# Patient Record
Sex: Female | Born: 1975 | Race: White | Hispanic: No | Marital: Married | Smoking: Never smoker
Health system: Southern US, Community
[De-identification: ages and names within clinical notes are randomized; demographics above are authoritative.]

## PROBLEM LIST (undated history)

## (undated) DIAGNOSIS — D259 Leiomyoma of uterus, unspecified: Secondary | ICD-10-CM

## (undated) DIAGNOSIS — K5790 Diverticulosis of intestine, part unspecified, without perforation or abscess without bleeding: Secondary | ICD-10-CM

## (undated) DIAGNOSIS — R011 Cardiac murmur, unspecified: Secondary | ICD-10-CM

## (undated) DIAGNOSIS — J45909 Unspecified asthma, uncomplicated: Secondary | ICD-10-CM

## (undated) DIAGNOSIS — N920 Excessive and frequent menstruation with regular cycle: Secondary | ICD-10-CM

## (undated) DIAGNOSIS — Z973 Presence of spectacles and contact lenses: Secondary | ICD-10-CM

## (undated) DIAGNOSIS — K8020 Calculus of gallbladder without cholecystitis without obstruction: Secondary | ICD-10-CM

## (undated) HISTORY — DX: Unspecified asthma, uncomplicated: J45.909

## (undated) HISTORY — DX: Diverticulosis of intestine, part unspecified, without perforation or abscess without bleeding: K57.90

## (undated) HISTORY — DX: Calculus of gallbladder without cholecystitis without obstruction: K80.20

---

## 2002-04-16 HISTORY — PX: TUBAL LIGATION: SHX77

## 2005-06-20 ENCOUNTER — Encounter: Admission: RE | Admit: 2005-06-20 | Discharge: 2005-06-20 | Payer: Self-pay | Admitting: Family Medicine

## 2006-03-12 ENCOUNTER — Encounter: Admission: RE | Admit: 2006-03-12 | Discharge: 2006-03-12 | Payer: Self-pay | Admitting: Family Medicine

## 2006-04-07 ENCOUNTER — Encounter: Admission: RE | Admit: 2006-04-07 | Discharge: 2006-04-07 | Payer: Self-pay | Admitting: Family Medicine

## 2006-08-14 ENCOUNTER — Other Ambulatory Visit: Admission: RE | Admit: 2006-08-14 | Discharge: 2006-08-14 | Payer: Self-pay | Admitting: Obstetrics & Gynecology

## 2007-07-16 ENCOUNTER — Encounter: Admission: RE | Admit: 2007-07-16 | Discharge: 2007-07-16 | Payer: Self-pay | Admitting: Family Medicine

## 2007-08-21 ENCOUNTER — Ambulatory Visit: Payer: Self-pay | Admitting: Vascular Surgery

## 2008-10-26 DIAGNOSIS — J45909 Unspecified asthma, uncomplicated: Secondary | ICD-10-CM | POA: Insufficient documentation

## 2008-11-24 ENCOUNTER — Other Ambulatory Visit: Admission: RE | Admit: 2008-11-24 | Discharge: 2008-11-24 | Payer: Self-pay | Admitting: Obstetrics & Gynecology

## 2009-01-14 HISTORY — PX: COLONOSCOPY: SHX174

## 2009-01-23 LAB — HM COLONOSCOPY: HM Colonoscopy: NORMAL

## 2009-03-09 ENCOUNTER — Encounter: Admission: RE | Admit: 2009-03-09 | Discharge: 2009-03-09 | Payer: Self-pay | Admitting: Family Medicine

## 2009-03-15 DIAGNOSIS — K8020 Calculus of gallbladder without cholecystitis without obstruction: Secondary | ICD-10-CM | POA: Insufficient documentation

## 2009-03-15 DIAGNOSIS — K5730 Diverticulosis of large intestine without perforation or abscess without bleeding: Secondary | ICD-10-CM | POA: Insufficient documentation

## 2009-07-12 DIAGNOSIS — J45901 Unspecified asthma with (acute) exacerbation: Secondary | ICD-10-CM | POA: Insufficient documentation

## 2009-08-16 DIAGNOSIS — K5790 Diverticulosis of intestine, part unspecified, without perforation or abscess without bleeding: Secondary | ICD-10-CM

## 2009-08-16 HISTORY — DX: Diverticulosis of intestine, part unspecified, without perforation or abscess without bleeding: K57.90

## 2009-09-16 DIAGNOSIS — K8020 Calculus of gallbladder without cholecystitis without obstruction: Secondary | ICD-10-CM

## 2009-09-16 HISTORY — DX: Calculus of gallbladder without cholecystitis without obstruction: K80.20

## 2009-09-16 HISTORY — PX: LAPAROSCOPIC CHOLECYSTECTOMY: SUR755

## 2009-09-25 ENCOUNTER — Ambulatory Visit: Payer: Self-pay | Admitting: Surgery

## 2009-09-26 ENCOUNTER — Ambulatory Visit: Payer: Self-pay | Admitting: Surgery

## 2010-05-03 DIAGNOSIS — F4322 Adjustment disorder with anxiety: Secondary | ICD-10-CM | POA: Insufficient documentation

## 2011-01-29 NOTE — Procedures (Signed)
DUPLEX DEEP VENOUS EXAM - LOWER EXTREMITY   INDICATION:  Bilateral edema, right greater than left, for several  months.   HISTORY:  Edema:  Bilateral, right greater than left.  Trauma/Surgery:  No.  Pain:  No, however, she complains of some discomfort in the right lower  calf into her foot.  PE:  No.  Previous DVT:  No.  Anticoagulants:  No.  Other:   DUPLEX EXAM:                CFV   SFV   PopV  PTV    GSV                R  L  R  L  R  L  R   L  R  L  Thrombosis    0  0  0  0  0  0  0   0  0  0  Spontaneous   +  +  +  +  +  +  +   +  +  +  Phasic        +  +  +  +  +  +  +   +  +  +  Augmentation  +  +  +  +  +  +  +   +  +  +  Compressible  +  +  +  +  +  +  +   +  +  +  Competent     +  +  +  +  +  +  +   +  +  +   Legend:  + - yes  o - no  p - partial  D - decreased   IMPRESSION:  No evidence of lower extremity deep venous thrombosis bilaterally.  Bilateral greater saphenous veins are noted to be competent.  Both posterior tibial artery signals are within normal limits.   A preliminary copy was faxed, and a preliminary report was called to Dr.  Stephannie Peters office.    _____________________________  Di Kindle. Edilia Bo, M.D.   DP/MEDQ  D:  08/21/2007  T:  08/22/2007  Job:  [id number]

## 2011-04-08 IMAGING — CT CT PELVIS W/ CM
2 of 4 series · 13 of 32 positions shown, 18 images · IV contrast (30CC OMNI 350 & [ID] OMNI 300)
Comparison: Ultrasound 06/20/2005

CT ABDOMEN

CLINICAL DATA: lower abdominal pain.

CT ABDOMEN AND PELVIS WITH CONTRAST
TECHNIQUE: Multidetector CT imaging of the abdomen and pelvis was
performed using the standard protocol following bolus
administration of intravenous contrast.
Contrast: 100 ml Omnipaque 300 IV.

[Series 2: abdomen w/ · axial · 0.82mm/px · z∈[-335,-45]mm · 5 of 88 slices shown, 10 images]
[im 15/88  soft-tissue]
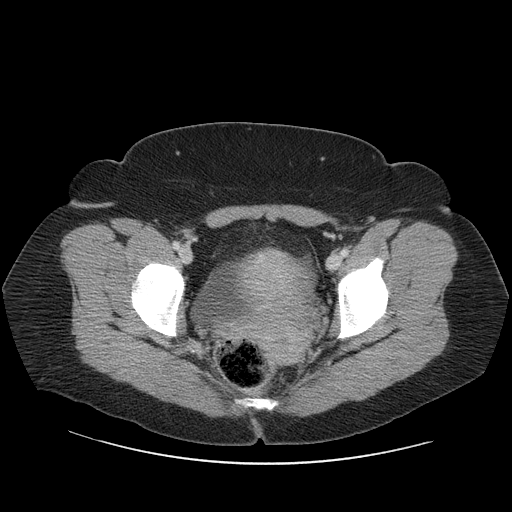
[im 15/88  bone]
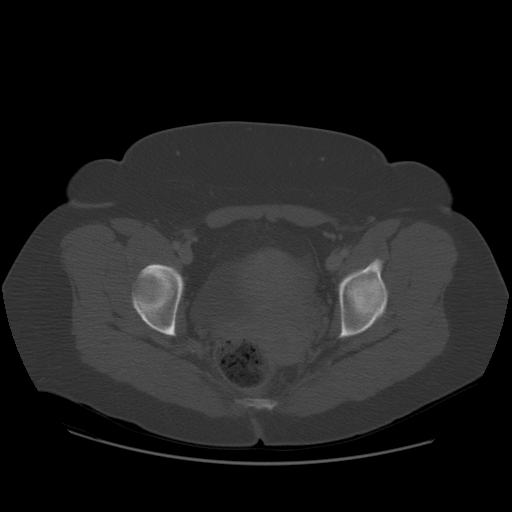
[im 30/88  soft-tissue]
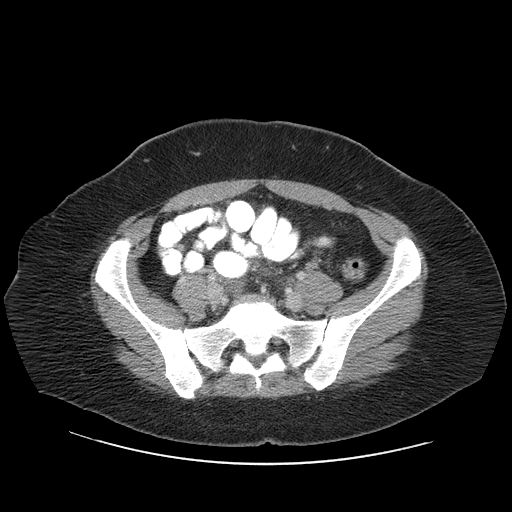
[im 30/88  lung]
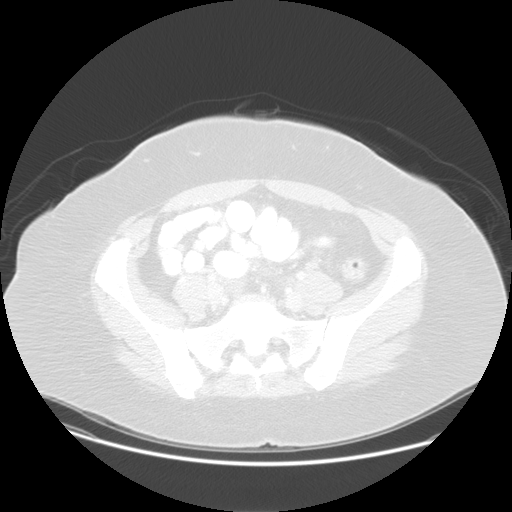
[im 44/88  soft-tissue]
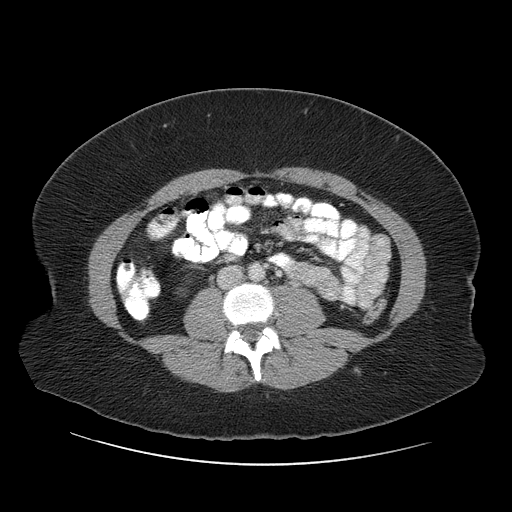
[im 44/88  lung]
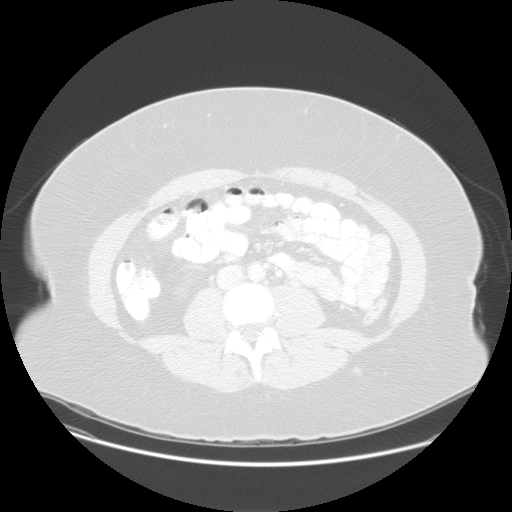
[im 59/88  soft-tissue]
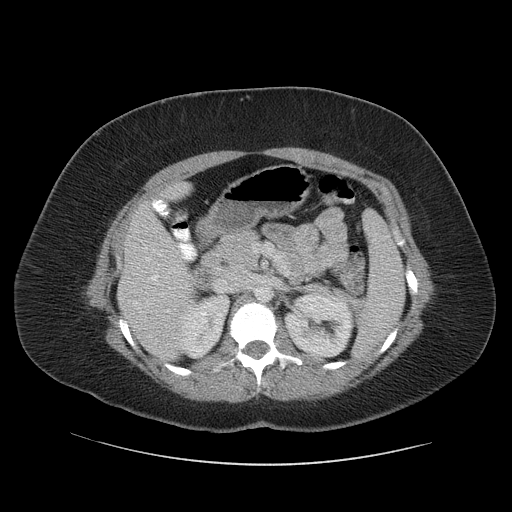
[im 59/88  lung]
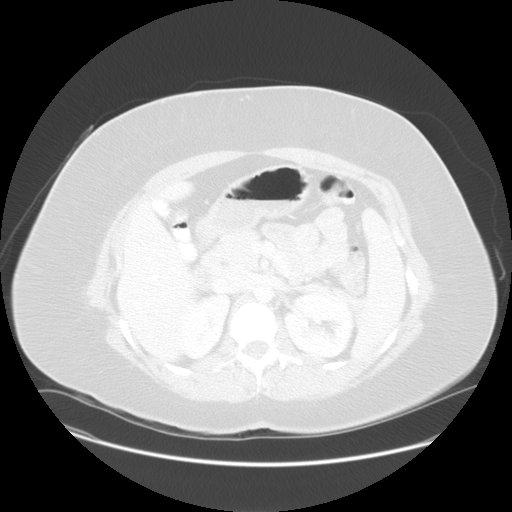
[im 73/88  soft-tissue]
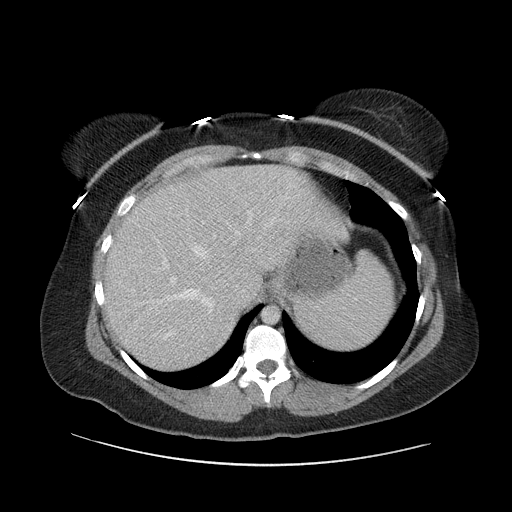
[im 73/88  lung]
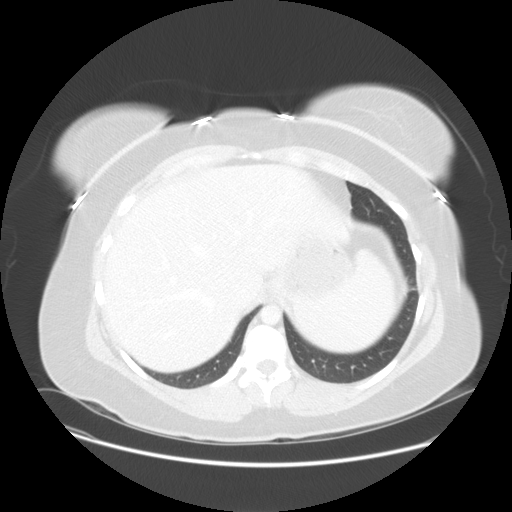

[Series 401: sagittal · sagittal · 0.91mm/px · 8 of 150 slices shown]
[im 14/150  soft-tissue]
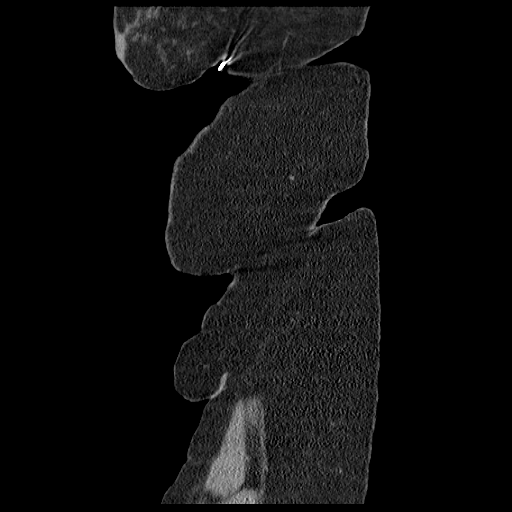
[im 28/150  soft-tissue]
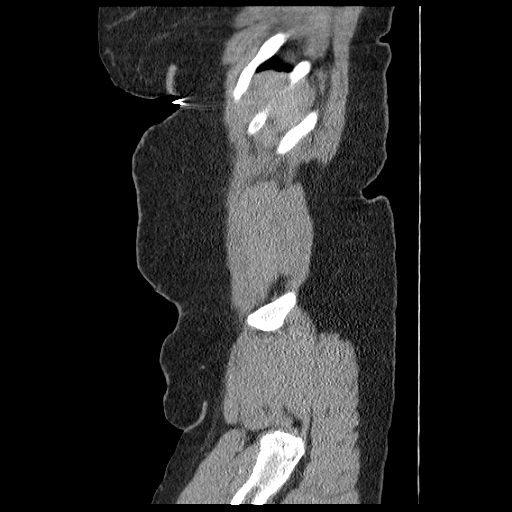
[im 55/150  soft-tissue]
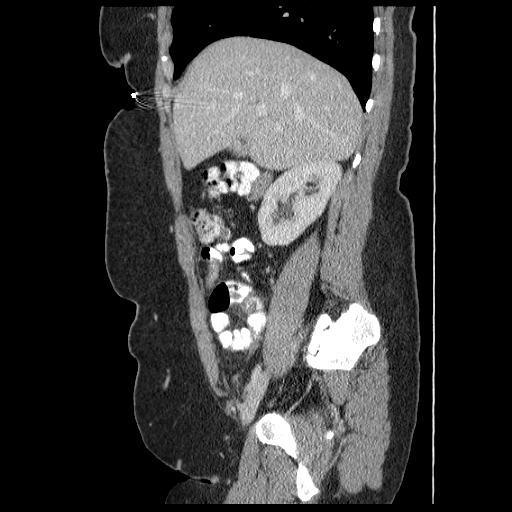
[im 68/150  soft-tissue]
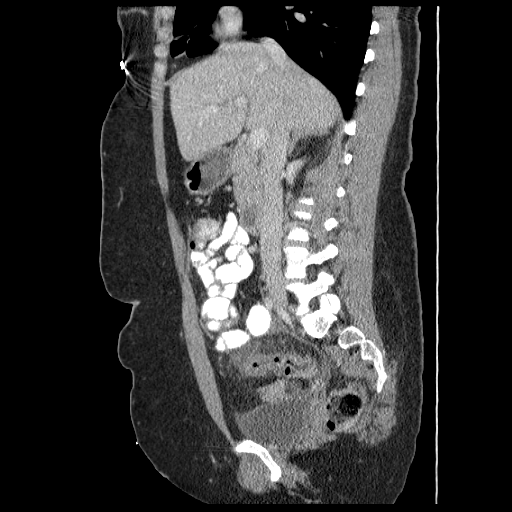
[im 82/150  soft-tissue]
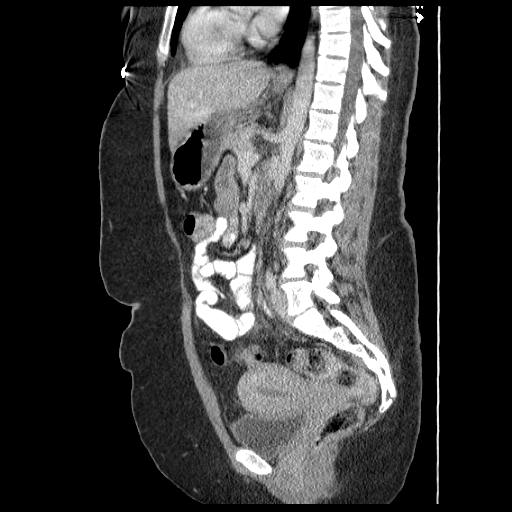
[im 95/150  soft-tissue]
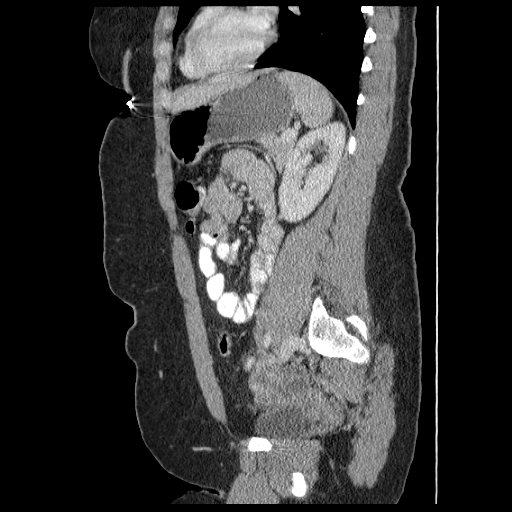
[im 122/150  soft-tissue]
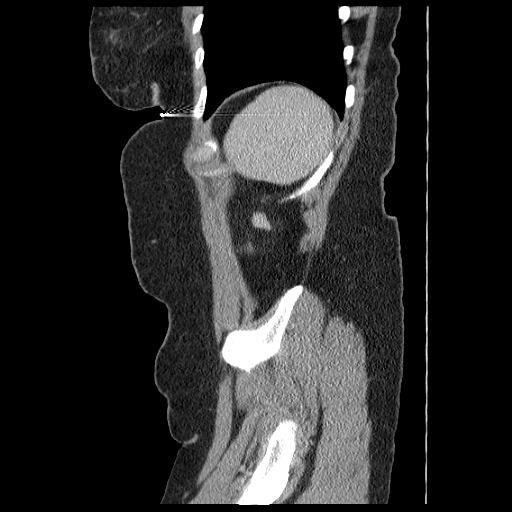
[im 136/150  soft-tissue]
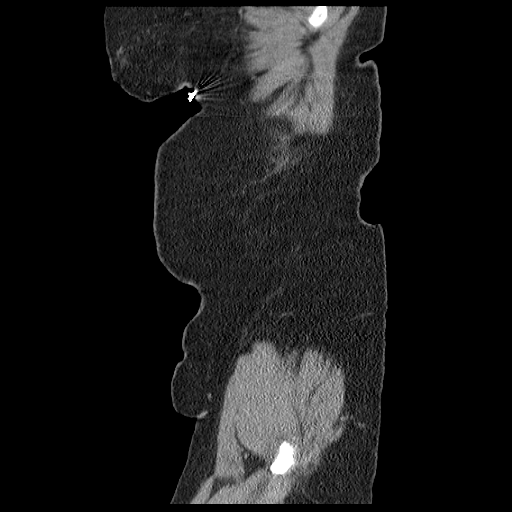

[13 of 32 positions shown; findings below may reference images not displayed]

FINDINGS: Linear densities in the right middle lobe, likely
scarring or atelectasis.  Left lung base clear.  No effusions.
Heart is normal size.

Gallstones fill the gallbladder which is contracted.  Liver,
spleen, pancreas, adrenals, kidneys unremarkable. No
hydronephrosis. No biliary ductal dilatation.

Small umbilical hernia present containing fat. Bowel grossly
unremarkable.  No free fluid, free air, or adenopathy. Aorta is
normal caliber.
IMPRESSION: Cholelithiasis.

CT PELVIS
FINDINGS: There are there is sigmoid diverticulosis.  There is an
area of abnormal circumferential wall thickening and surrounding
inflammatory change, compatible with active diverticulitis.  No
complicating feature.  There is a tiny amount of free fluid in the
pelvis.

Uterus and adnexa unremarkable.  Appendix is not definitively seen.
Bladder unremarkable.

No acute bony abnormality.  Sclerotic focus in the right iliac bone
likely bone island.
IMPRESSION: Inflammatory change of wall thickening involving the sigmoid colon
with adjacent sigmoid diverticula.  Findings compatible with active
diverticulitis.

Tiny amount of free fluid in the pelvis, likely physiologic.

## 2011-06-20 LAB — HM PAP SMEAR: HM Pap smear: NORMAL

## 2013-02-19 ENCOUNTER — Encounter: Payer: Self-pay | Admitting: *Deleted

## 2013-02-22 ENCOUNTER — Ambulatory Visit: Payer: Self-pay | Admitting: Obstetrics & Gynecology

## 2013-03-24 ENCOUNTER — Ambulatory Visit: Payer: Self-pay | Admitting: Obstetrics & Gynecology

## 2013-04-27 ENCOUNTER — Encounter: Payer: Self-pay | Admitting: Obstetrics & Gynecology

## 2013-04-27 ENCOUNTER — Other Ambulatory Visit: Payer: Self-pay | Admitting: *Deleted

## 2013-04-27 ENCOUNTER — Ambulatory Visit (INDEPENDENT_AMBULATORY_CARE_PROVIDER_SITE_OTHER): Payer: Managed Care, Other (non HMO) | Admitting: Obstetrics & Gynecology

## 2013-04-27 VITALS — BP 126/78 | HR 64 | Resp 16 | Ht 61.5 in | Wt 171.2 lb

## 2013-04-27 DIAGNOSIS — Z01419 Encounter for gynecological examination (general) (routine) without abnormal findings: Secondary | ICD-10-CM

## 2013-04-27 DIAGNOSIS — Z124 Encounter for screening for malignant neoplasm of cervix: Secondary | ICD-10-CM

## 2013-04-27 DIAGNOSIS — Z0000 Encounter for general adult medical examination without abnormal findings: Secondary | ICD-10-CM

## 2013-04-27 DIAGNOSIS — D219 Benign neoplasm of connective and other soft tissue, unspecified: Secondary | ICD-10-CM

## 2013-04-27 LAB — POCT URINALYSIS DIPSTICK
Bilirubin, UA: NEGATIVE
Glucose, UA: NEGATIVE
Ketones, UA: NEGATIVE
Nitrite, UA: NEGATIVE
Protein, UA: NEGATIVE
Urobilinogen, UA: NEGATIVE
pH, UA: 6

## 2013-04-27 NOTE — Patient Instructions (Signed)

## 2013-04-27 NOTE — Progress Notes (Signed)
37 y.o. G2P2 Divorced Caucasian Fe here for annual exam.  Cycles fairly regular.  Flow can last 3-7 days.  Does have occasional heavy days.   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  Brought labs with her.  All normal.  Will scan copy.  LMP: 04/21/13     Sexually active: yes  The current method of family planning is tubal ligation.    Exercising: yes  walking  Smoker:  no  Health Maintenance: Pap:  06/20/2011  Negative  MMG:  never Colonoscopy:  01/2009 repeat 5 years BMD:   never TDaP:  2010 Labs: Urine:WBC-+1, RBC-trace, PH 6.0   reports that she has never smoked. She has never used smokeless tobacco. She reports that she does not drink alcohol or use illicit drugs.  Past Medical History  Diagnosis Date  . Diverticulosis 08/2009    Past Surgical History  Procedure Laterality Date  . Colonoscopy  5/10    Current Outpatient Prescriptions  Medication Sig Dispense Refill  . ALBUTEROL IN Inhale into the lungs.       No current facility-administered medications for this visit.    No family history on file.  ROS:  Pertinent items are noted in HPI.  Otherwise, a comprehensive ROS was negative.  Exam:   Ht 5' 1.5" (1.562 m)  Wt 171 lb 3.2 oz (77.656 kg)  BMI 31.83 kg/m2 Height: 5' 1.5" (156.2 cm)  Ht Readings from Last 3 Encounters:  04/27/13 5' 1.5" (1.562 m)    General appearance: alert, cooperative and appears stated age Head: Normocephalic, without obvious abnormality, atraumatic Neck: no adenopathy, supple, symmetrical, trachea midline and thyroid normal to inspection and palpation Lungs: clear to auscultation bilaterally Breasts: normal appearance, no masses or tenderness Heart: regular rate and rhythm Abdomen: soft, non-tender; no masses,  no organomegaly Extremities: extremities normal, atraumatic, no cyanosis or edema Skin: Skin color, texture, turgor normal. No rashes or lesions Lymph nodes: Cervical, supraclavicular, and axillary nodes  normal. No abnormal inguinal nodes palpated Neurologic: Grossly normal   Pelvic: External genitalia:  no lesions              Urethra:  normal appearing urethra with no masses, tenderness or lesions              Bartholin's and Skene's: normal                 Vagina: normal appearing vagina with normal color and discharge, no lesions              Cervix: no lesions              Pap taken: yes Bimanual Exam:  Uterus:  Enlarged, mobile, 3-4cm ant fibroid              Adnexa: normal adnexa and no mass, fullness, tenderness               Rectovaginal: Confirms               Anus:  normal sphincter tone, no lesions  A:  Well Woman with normal exam Fibroid uterus  P:   Pap with HR HPV Mammogram starting age 58 Return for PUS return annually or prn  An After Visit Summary was printed and given to the patient.

## 2013-04-28 ENCOUNTER — Telehealth: Payer: Self-pay | Admitting: Obstetrics & Gynecology

## 2013-04-28 NOTE — Telephone Encounter (Signed)
Patient returned Carolynn's call. No other contact number for today other than cell phone per patient.

## 2013-04-28 NOTE — Telephone Encounter (Signed)
Unable to leave message. Vm has not been set up. Call to patient to discuss insurance benefits for PUS scheduled for tmro (8/14)

## 2013-04-29 ENCOUNTER — Encounter: Payer: Self-pay | Admitting: Obstetrics & Gynecology

## 2013-04-29 ENCOUNTER — Ambulatory Visit (INDEPENDENT_AMBULATORY_CARE_PROVIDER_SITE_OTHER): Payer: Managed Care, Other (non HMO) | Admitting: Obstetrics & Gynecology

## 2013-04-29 ENCOUNTER — Ambulatory Visit (INDEPENDENT_AMBULATORY_CARE_PROVIDER_SITE_OTHER): Payer: Managed Care, Other (non HMO)

## 2013-04-29 DIAGNOSIS — N852 Hypertrophy of uterus: Secondary | ICD-10-CM

## 2013-04-29 DIAGNOSIS — D219 Benign neoplasm of connective and other soft tissue, unspecified: Secondary | ICD-10-CM

## 2013-04-29 DIAGNOSIS — D259 Leiomyoma of uterus, unspecified: Secondary | ICD-10-CM

## 2013-04-29 LAB — IPS PAP TEST WITH HPV

## 2013-04-29 NOTE — Progress Notes (Signed)
37 y.o.  G2P2 MWF here for a pelvic ultrasound.  Enlarged/bulky feeling uterus noted on physical exam.  She does have some cycles that are heavy but doesn't want anything done about that unless there is a problem noted today.  Patient's last menstrual period was 04/21/2013.  Sexually active:  yes  Contraception: bilateral tubal ligation  FINDINGS: UTERUS: 8.3 x 6.3 x 4.8cm without fibroids EMS: 6.56mm ADNEXA:   Left ovary 3.0 x 1.9 x 2.2cm   Right ovary 2.8 x 1.9 x 1.9cm.  Both ovaries with normal appearing follicles CUL DE SAC: neg for free fluid  Assessment:  Enlarged uterus Plan: Follow-up one year or prn new issues

## 2013-04-29 NOTE — Patient Instructions (Signed)
Please call if you have any new issues/problems 

## 2013-07-22 ENCOUNTER — Other Ambulatory Visit: Payer: Self-pay

## 2014-05-10 ENCOUNTER — Encounter: Payer: Self-pay | Admitting: Obstetrics & Gynecology

## 2014-07-18 ENCOUNTER — Encounter: Payer: Self-pay | Admitting: Obstetrics & Gynecology

## 2014-07-20 ENCOUNTER — Ambulatory Visit: Payer: Managed Care, Other (non HMO) | Admitting: Obstetrics & Gynecology

## 2014-07-22 ENCOUNTER — Ambulatory Visit: Payer: Managed Care, Other (non HMO) | Admitting: Obstetrics & Gynecology

## 2014-08-10 ENCOUNTER — Ambulatory Visit: Payer: Managed Care, Other (non HMO) | Admitting: Obstetrics & Gynecology

## 2015-07-25 ENCOUNTER — Encounter: Payer: Self-pay | Admitting: Obstetrics & Gynecology

## 2015-07-25 ENCOUNTER — Ambulatory Visit (INDEPENDENT_AMBULATORY_CARE_PROVIDER_SITE_OTHER): Payer: Managed Care, Other (non HMO) | Admitting: Obstetrics & Gynecology

## 2015-07-25 VITALS — BP 122/78 | HR 80 | Resp 14 | Ht 62.0 in | Wt 183.0 lb

## 2015-07-25 DIAGNOSIS — Z800 Family history of malignant neoplasm of digestive organs: Secondary | ICD-10-CM

## 2015-07-25 DIAGNOSIS — Z01419 Encounter for gynecological examination (general) (routine) without abnormal findings: Secondary | ICD-10-CM

## 2015-07-25 DIAGNOSIS — Z124 Encounter for screening for malignant neoplasm of cervix: Secondary | ICD-10-CM

## 2015-07-25 MED ORDER — ALPRAZOLAM 0.5 MG PO TABS: 0.5000 mg | ORAL_TABLET | ORAL | Status: DC | PRN

## 2015-07-25 NOTE — Progress Notes (Signed)
Patient ID: Christina Faulkner, female   DOB: February 20, 1976, 39 y.o.   MRN: [id number]   39 y.o. G2P2 DivorcedCaucasianF here for annual exam.  Very busy with work.  Cycles are regular.    Father diagnosed with colon cancer in May.  This was his second colon cancer diagnosis and was at stage 3.  He was 63.  Initial colon cancer at age 70.  Paternal uncle had a polyp at age 9.  Was also diagnosed with colon cancer.  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    Pt has already initiated colonoscopy testing.  Last was 10/15.  She knows she needs to have genetic testing but needs my help getting this scheduled.    Work is busy.  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     Patient's last menstrual period was 07/06/2015.          Sexually active: Yes.    The current method of family planning is tubal ligation.    Exercising: No.  The patient does not participate in regular exercise at present. Smoker:  no  Health Maintenance: Pap:  04-27-13 WNL NEG HR HPV History of abnormal Pap:  no MMG:  Never Colonoscopy:  06/2014 WNL BMD:   Never TDaP:  05/2015  Screening Labs: PCP does labs    reports that she has never smoked. She has never used smokeless tobacco. She reports that she does not drink alcohol or use illicit drugs.  Past Medical History  Diagnosis Date  . Diverticulosis 08/2009  . Asthma   . Cholelithiasis     Past Surgical History  Procedure Laterality Date  . Colonoscopy  5/10  . Tubal ligation  8/03  . Laparoscopic cholecystectomy  1/11    hernia repair    Current Outpatient Prescriptions  Medication Sig Dispense Refill  . ALBUTEROL IN Inhale into the lungs.    . ALPRAZolam (XANAX) 0.5 MG tablet Take 0.5 mg by mouth as needed for sleep.     No current facility-administered medications for this visit.    Family History  Problem Relation Age of Onset  . Cancer Father     removed kidney-aggressive tumor in ureter  . Colon cancer Father 24    63 recurrent colon cancer   . Colon cancer Paternal Grandfather   . Diabetes Maternal Aunt   . Diabetes Maternal Grandmother   . Diabetes Maternal Grandfather   . Hypertension Maternal Grandfather   . Heart disease Maternal Grandfather   . Migraines Sister     ROS:  Pertinent items are noted in HPI.  Otherwise, a comprehensive ROS was negative.  Exam:   BP 122/78 mmHg  Pulse 80  Resp 14  Ht 5\' 2"  (1.575 m)  Wt 183 lb (83.008 kg)  BMI 33.46 kg/m2  LMP 07/06/2015  Weight change: +12#  Height: 5\' 2"  (157.5 cm)  Ht Readings from Last 3 Encounters:  07/25/15 5\' 2"  (1.575 m)  04/27/13 5' 1.5" (1.562 m)    General appearance: alert, cooperative and appears stated age Head: Normocephalic, without obvious abnormality, atraumatic Neck: no adenopathy, supple, symmetrical, trachea midline and thyroid normal to inspection and palpation Lungs: clear to auscultation bilaterally Breasts: normal appearance, no masses or tenderness Heart: regular rate and rhythm Abdomen: soft, non-tender; bowel sounds normal; no masses,  no organomegaly Extremities: extremities normal, atraumatic, no cyanosis or edema Skin: Skin color, texture, turgor normal. No rashes or lesions Lymph nodes: Cervical, supraclavicular, and axillary nodes normal. No abnormal inguinal nodes palpated  Neurologic: Grossly normal   Pelvic: External genitalia:  no lesions              Urethra:  normal appearing urethra with no masses, tenderness or lesions              Bartholins and Skenes: normal                 Vagina: normal appearing vagina with normal color and discharge, no lesions              Cervix: no lesions              Pap taken: Yes.   Bimanual Exam:  Uterus:  normal size, contour, position, consistency, mobility, non-tender              Adnexa: normal adnexa and no mass, fullness, tenderness               Rectovaginal: Confirms               Anus:  normal sphincter tone, no lesions  Chaperone was present for exam.  A:  Well Woman  with normal exam Fibroid uterus Family hx of colon cancer, father now with second diagnosis of colon cancer (first diagnosis was 93) and also paternal uncle  P: Mammogram starting age 63 Pap with HR HPV obtained today Labs with Christina Faulkner Will plan genetic consultation with pt for Lynch syndrome screening.  This needs to be done through medical genetics testing with [facility] if possible.  Pt not sure about what specific clinic.  I will need to do some research and contact her with this information. return annually or prn

## 2015-07-26 LAB — IPS PAP TEST WITH REFLEX TO HPV

## 2015-07-27 DIAGNOSIS — Z800 Family history of malignant neoplasm of digestive organs: Secondary | ICD-10-CM | POA: Insufficient documentation

## 2015-08-01 ENCOUNTER — Telehealth: Payer: Self-pay | Admitting: Emergency Medicine

## 2015-08-01 DIAGNOSIS — Z800 Family history of malignant neoplasm of digestive organs: Secondary | ICD-10-CM

## 2015-08-01 NOTE — Telephone Encounter (Signed)
-----   Message from Megan Salon, MD sent at 07/27/2015  5:22 PM EST ----- Regarding: appt for pt Christina Faulkner, Mrs. Christina Faulkner, seen on Tues, has a new family diagnosis of Lynch Syndrome in her father and paternal uncle.  Both have had colon cancer.  Her father is now with his second diagnosis.    Pt has neg colonoscopy 10/15.  With this new lynch syndrome diagnosis, she needs to be tested.  She works for [employer].  Can you schedule appt for her with at: [facility] with [address] [place], [place] [address]   [phone]  Pt was advised to make sure she takes copy of her father's/uncle's results with her so she can be tested for their exact genetic abnormality.  Please remind her.  Thanks.  MSM

## 2015-08-01 NOTE — Telephone Encounter (Signed)
Called Genetics and transferred to oncology scheduling at [phone].  Left message to request referral information.  Genetics Fax [phone].

## 2015-08-04 NOTE — Telephone Encounter (Signed)
2nd call to genetics for scheduling. Sent to voicemail without speaking to a representative. Called [phone] during regular business hours. Left voicemail requesting return call for scheduling. Will fax all information to [phone] in addition to voicemail.

## 2015-08-29 NOTE — Telephone Encounter (Signed)
Patient confirmed she is scheduled for appointment 09/21/15.

## 2015-10-13 ENCOUNTER — Ambulatory Visit: Payer: Managed Care, Other (non HMO) | Admitting: Obstetrics & Gynecology

## 2016-07-23 ENCOUNTER — Telehealth: Payer: Self-pay | Admitting: Obstetrics & Gynecology

## 2016-07-23 NOTE — Telephone Encounter (Signed)
Left message to call and reschedule cancelled appointment.

## 2016-08-26 ENCOUNTER — Telehealth: Payer: Self-pay | Admitting: Obstetrics & Gynecology

## 2016-08-26 ENCOUNTER — Ambulatory Visit: Payer: Managed Care, Other (non HMO) | Admitting: Obstetrics & Gynecology

## 2016-08-26 NOTE — Telephone Encounter (Signed)
Patient is sick and cancelled her aex today. Will call back to reschedule. No dnka fee charged.

## 2016-10-28 ENCOUNTER — Ambulatory Visit (INDEPENDENT_AMBULATORY_CARE_PROVIDER_SITE_OTHER): Payer: Managed Care, Other (non HMO) | Admitting: Obstetrics & Gynecology

## 2016-10-28 ENCOUNTER — Encounter: Payer: Self-pay | Admitting: Obstetrics & Gynecology

## 2016-10-28 VITALS — BP 106/82 | HR 88 | Resp 14 | Ht 61.5 in | Wt 160.6 lb

## 2016-10-28 DIAGNOSIS — Z01419 Encounter for gynecological examination (general) (routine) without abnormal findings: Secondary | ICD-10-CM

## 2016-10-28 NOTE — Progress Notes (Signed)
41 y.o. G2P2 Divorced Caucasian F here for annual exam.  Son is graduating from high school this year and wants to be a Agricultural consultant.    Job has gotten better.  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    Cycles regular and last about 5-7 days.  Second and third days are heaviest.   Patient's last menstrual period was 10/20/2016.          Sexually active: Yes.    The current method of family planning is tubal ligation.    Exercising: Yes.    walking Smoker:  no  Health Maintenance: Pap:  07/25/15 negative, 04/27/13 negative, HR HPV negative  History of abnormal Pap:  no MMG:  never Colonoscopy:  06/2014 normal.  Follow up 5 years.  BMD:   never TDaP:  2010  Pneumonia vaccine(s):  never Zostavax:   never Hep C testing: not indicated  Screening Labs: PCP, Hb today: PCP, Urine today: PCP   reports that she has never smoked. She has never used smokeless tobacco. She reports that she does not drink alcohol or use drugs.  Past Medical History:  Diagnosis Date  . Asthma   . Cholelithiasis   . Diverticulosis 08/2009    Past Surgical History:  Procedure Laterality Date  . COLONOSCOPY  5/10  . LAPAROSCOPIC CHOLECYSTECTOMY  1/11   hernia repair  . TUBAL LIGATION  8/03    Current Outpatient Prescriptions  Medication Sig Dispense Refill  . albuterol (PROAIR HFA) 108 (90 Base) MCG/ACT inhaler INHALE 2 PUFFS INTO THE LUNGS EVERY 6 HOURS AS NEEDED FOR WHEEZING    . ALPRAZolam (XANAX) 0.5 MG tablet Take 1 tablet (0.5 mg total) by mouth as needed for sleep. 30 tablet 2   No current facility-administered medications for this visit.     Family History  Problem Relation Age of Onset  . Cancer Father     removed kidney-aggressive tumor in ureter  . Colon cancer Father 24    63 recurrent colon cancer  . Colon cancer Paternal Grandfather   . Diabetes Maternal Aunt   . Diabetes Maternal Grandmother   . Diabetes Maternal Grandfather   . Hypertension Maternal  Grandfather   . Heart disease Maternal Grandfather   . Migraines Sister     ROS:  Pertinent items are noted in HPI.  Otherwise, a comprehensive ROS was negative.  Exam:   BP 106/82 (BP Location: Right Arm, Patient Position: Sitting, Cuff Size: Normal)   Pulse 88   Resp 14   Ht 5' 1.5" (1.562 m)   Wt 160 lb 9.6 oz (72.8 kg)   LMP 10/20/2016   BMI 29.85 kg/m   Weight:  -23# change:  Height: 5' 1.5" (156.2 cm)  Ht Readings from Last 3 Encounters:  10/28/16 5' 1.5" (1.562 m)  07/25/15 5\' 2"  (1.575 m)  04/27/13 5' 1.5" (1.562 m)   General appearance: alert, cooperative and appears stated age Head: Normocephalic, without obvious abnormality, atraumatic Neck: no adenopathy, supple, symmetrical, trachea midline and thyroid normal to inspection and palpation Lungs: clear to auscultation bilaterally Breasts: normal appearance, no masses or tenderness Heart: regular rate and rhythm Abdomen: soft, non-tender; bowel sounds normal; no masses,  no organomegaly Extremities: extremities normal, atraumatic, no cyanosis or edema Skin: Skin color, texture, turgor normal. No rashes or lesions Lymph nodes: Cervical, supraclavicular, and axillary nodes normal. No abnormal inguinal nodes palpated Neurologic: Grossly normal   Pelvic: External genitalia:  no lesions  Urethra:  normal appearing urethra with no masses, tenderness or lesions              Bartholins and Skenes: normal                 Vagina: normal appearing vagina with normal color and discharge, no lesions              Cervix: no lesions              Pap taken: No. Bimanual Exam:  Uterus:  normal size, contour, position, consistency, mobility, non-tender              Adnexa: normal adnexa and no mass, fullness, tenderness               Rectovaginal: Confirms               Anus:  normal sphincter tone, no lesions  Chaperone was present for exam.  A: Well woman exam H/o uterine fibroids Family hx of colon cancer  (father with diagnosis and recurerrence and uncle with hx, PGF with hx.  Father's colon cancer was lynch positive)   P: Mammogram guidelines discussed.  Order given. Pap not obtained.  Neg 2016, Neg with neg HR HVP 2014 Lab work planned next week with Vicenta Aly Pt did "look into" lynch testing last year.  States insurance wouldn't cover.  Highly encouraged her to see genetics as they may know ways to have this covered or to reduce costs.  Pt states she will consider consultation this year. AEX 1 year or follow PRN

## 2016-11-12 ENCOUNTER — Ambulatory Visit: Payer: Managed Care, Other (non HMO) | Admitting: Obstetrics & Gynecology

## 2018-02-03 NOTE — Progress Notes (Deleted)
42 y.o. G2P2 DivorcedCaucasianF here for annual exam.    No LMP recorded.          Sexually active: {yes no:314532}  The current method of family planning is tubal ligation.    Exercising: {yes no:314532}  {types:19826} Smoker:  no  Health Maintenance: Pap: 08-04-15 Neg, 04-27-13 Neg:Neg HR HPV History of abnormal Pap:  {YES NO:22349} MMG:  *** Colonoscopy: 06/2014 normal;next due 06/2019 BMD:   n/a TDaP:  2010 Pneumonia vaccine(s):  *** Shingrix:   *** Hep C testing: *** Screening Labs: ***, Hb today: ***, Urine today: ***   reports that she has never smoked. She has never used smokeless tobacco. She reports that she does not drink alcohol or use drugs.  Past Medical History:  Diagnosis Date  . Asthma   . Cholelithiasis   . Diverticulosis 08/2009    Past Surgical History:  Procedure Laterality Date  . COLONOSCOPY  5/10  . LAPAROSCOPIC CHOLECYSTECTOMY  1/11   hernia repair  . TUBAL LIGATION  8/03    Current Outpatient Medications  Medication Sig Dispense Refill  . albuterol (PROAIR HFA) 108 (90 Base) MCG/ACT inhaler INHALE 2 PUFFS INTO THE LUNGS EVERY 6 HOURS AS NEEDED FOR WHEEZING    . ALPRAZolam (XANAX) 0.5 MG tablet Take 1 tablet (0.5 mg total) by mouth as needed for sleep. 30 tablet 2   No current facility-administered medications for this visit.     Family History  Problem Relation Age of Onset  . Cancer Father        removed kidney-aggressive tumor in ureter  . Colon cancer Father 24       63 recurrent colon cancer  . Colon cancer Paternal Grandfather   . Diabetes Maternal Aunt   . Diabetes Maternal Grandmother   . Diabetes Maternal Grandfather   . Hypertension Maternal Grandfather   . Heart disease Maternal Grandfather   . Migraines Sister     ROS  Exam:   There were no vitals taken for this visit.  Height:      Ht Readings from Last 3 Encounters:  10/28/16 5' 1.5" (1.562 m)  07/25/15 5\' 2"  (1.575 m)  04/27/13 5' 1.5" (1.562 m)    General  appearance: alert, cooperative and appears stated age Head: Normocephalic, without obvious abnormality, atraumatic Neck: no adenopathy, supple, symmetrical, trachea midline and thyroid {EXAM; THYROID:18604} Lungs: clear to auscultation bilaterally Breasts: {Exam; breast:13139::"normal appearance, no masses or tenderness"} Heart: regular rate and rhythm Abdomen: soft, non-tender; bowel sounds normal; no masses,  no organomegaly Extremities: extremities normal, atraumatic, no cyanosis or edema Skin: Skin color, texture, turgor normal. No rashes or lesions Lymph nodes: Cervical, supraclavicular, and axillary nodes normal. No abnormal inguinal nodes palpated Neurologic: Grossly normal   Pelvic: External genitalia:  no lesions              Urethra:  normal appearing urethra with no masses, tenderness or lesions              Bartholins and Skenes: normal                 Vagina: normal appearing vagina with normal color and discharge, no lesions              Cervix: {exam; cervix:14595}              Pap taken: {yes no:314532} Bimanual Exam:  Uterus:  {exam; uterus:12215}              Adnexa: {exam;  adnexa:12223}               Rectovaginal: Confirms               Anus:  normal sphincter tone, no lesions  Chaperone was present for exam.  A:  Well Woman with normal exam  P:   {plan; gyn:5269::"mammogram","pap smear","return annually or prn"}

## 2018-02-06 ENCOUNTER — Ambulatory Visit: Payer: Managed Care, Other (non HMO) | Admitting: Obstetrics & Gynecology

## 2018-04-09 ENCOUNTER — Other Ambulatory Visit (HOSPITAL_COMMUNITY)
Admission: RE | Admit: 2018-04-09 | Discharge: 2018-04-09 | Disposition: A | Discharge status: Home / Self Care | Payer: Managed Care, Other (non HMO) | Source: Ambulatory Visit | Attending: Obstetrics & Gynecology | Admitting: Obstetrics & Gynecology

## 2018-04-09 ENCOUNTER — Ambulatory Visit: Payer: Managed Care, Other (non HMO) | Admitting: Obstetrics & Gynecology

## 2018-04-09 ENCOUNTER — Encounter

## 2018-04-09 ENCOUNTER — Encounter: Payer: Self-pay | Admitting: Obstetrics & Gynecology

## 2018-04-09 VITALS — BP 130/68 | HR 68 | Resp 14 | Ht 62.0 in | Wt 164.0 lb

## 2018-04-09 DIAGNOSIS — N852 Hypertrophy of uterus: Secondary | ICD-10-CM

## 2018-04-09 DIAGNOSIS — Z01419 Encounter for gynecological examination (general) (routine) without abnormal findings: Secondary | ICD-10-CM | POA: Diagnosis not present

## 2018-04-09 DIAGNOSIS — Z124 Encounter for screening for malignant neoplasm of cervix: Secondary | ICD-10-CM | POA: Diagnosis not present

## 2018-04-09 MED ORDER — ALPRAZOLAM 0.5 MG PO TABS: 0.5000 mg | ORAL_TABLET | ORAL | 1 refills | Status: AC | PRN

## 2018-04-09 NOTE — Progress Notes (Addendum)
42 y.o. G2P2 DivorcedCaucasianF here for annual exam.  Doing well.  Father in law had quick illness last year that took a lot of care.  He passed after four months.    Son just graduated the [employer], trade union and doing EMT training now.  Will go to work with a Musician after he finishes the EMT training.     Doing college tours right now for daughter.  She would love to go to [employer] [facility] [employer].    Cycles are regular.  About every other one is heavy with lots of cramping.    Patient's last menstrual period was 03/22/2018 (exact date).          Sexually active: Yes.    The current method of family planning is tubal ligation.    Exercising: Yes.    Walking  Smoker:  no  Health Maintenance: Pap:  07/25/15 History of abnormal Pap:  no MMG:  na Colonoscopy:  12/2013 BMD:   no TDaP:  2010 Pneumonia vaccine(s):  no Shingrix:   no Hep C testing: not indicated Screening Labs: 2/19   reports that she has never smoked. She has never used smokeless tobacco. She reports that she does not drink alcohol or use drugs.  Past Medical History:  Diagnosis Date  . Asthma   . Cholelithiasis   . Diverticulosis 08/2009    Past Surgical History:  Procedure Laterality Date  . COLONOSCOPY  5/10  . LAPAROSCOPIC CHOLECYSTECTOMY  1/11   hernia repair  . TUBAL LIGATION  8/03    Current Outpatient Medications  Medication Sig Dispense Refill  . ALPRAZolam (XANAX) 0.5 MG tablet Take 1 tablet (0.5 mg total) by mouth as needed for sleep. 30 tablet 2  . albuterol (PROAIR HFA) 108 (90 Base) MCG/ACT inhaler INHALE 2 PUFFS INTO THE LUNGS EVERY 6 HOURS AS NEEDED FOR WHEEZING     No current facility-administered medications for this visit.     Family History  Problem Relation Age of Onset  . Cancer Father        removed kidney-aggressive tumor in ureter  . Colon cancer Father 24       63 recurrent colon cancer  . Colon cancer Paternal Grandfather   . Diabetes Maternal Aunt   . Diabetes Maternal Grandmother    . Diabetes Maternal Grandfather   . Hypertension Maternal Grandfather   . Heart disease Maternal Grandfather   . Migraines Sister     Review of Systems  Constitutional: Negative.   HENT: Negative.   Eyes: Negative.   Respiratory: Negative.   Cardiovascular: Negative.   Gastrointestinal: Negative.   Genitourinary: Negative.   Musculoskeletal: Negative.   Skin: Negative.   Neurological: Negative.   Endo/Heme/Allergies: Negative.   Psychiatric/Behavioral: Negative.     Exam:   BP 130/68 (BP Location: Right Arm, Patient Position: Sitting, Cuff Size: Normal)   Pulse 68   Resp 14   Ht 5\' 2"  (1.575 m)   Wt 164 lb (74.4 kg)   LMP 03/22/2018 (Exact Date)   BMI 30.00 kg/m    Height: 5\' 2"  (157.5 cm)  Ht Readings from Last 3 Encounters:  04/09/18 5\' 2"  (1.575 m)  10/28/16 5' 1.5" (1.562 m)  07/25/15 5\' 2"  (1.575 m)    General appearance: alert, cooperative and appears stated age Head: Normocephalic, without obvious abnormality, atraumatic Neck: no adenopathy, supple, symmetrical, trachea midline and thyroid normal to inspection and palpation Lungs: clear to auscultation bilaterally Breasts: normal appearance, no masses or tenderness Heart: regular  rate and rhythm Abdomen: soft, non-tender; bowel sounds normal; no masses,  no organomegaly Extremities: extremities normal, atraumatic, no cyanosis or edema Skin: Skin color, texture, turgor normal. No rashes or lesions Lymph nodes: Cervical, supraclavicular, and axillary nodes normal. No abnormal inguinal nodes palpated Neurologic: Grossly normal   Pelvic: External genitalia:  no lesions              Urethra:  normal appearing urethra with no masses, tenderness or lesions              Bartholins and Skenes: normal                 Vagina: normal appearing vagina with normal color and discharge, no lesions              Cervix: no lesions              Pap taken: Yes.   Bimanual Exam:  Uterus:  Enlarged and feels like there is a  3-4cm fibroid off to the right              Adnexa: normal adnexa and no mass, fullness, tenderness               Rectovaginal: Confirms               Anus:  normal sphincter tone, no lesions  Chaperone was present for exam.  A:  Well Woman with normal exam H/O menorrhagia every other month Enlarged uterus Family hx of colon cancer (father's was lynch +).  Repeat due next year.  Asthma  P:   Mammogram guidelines reviewed.  Order given.  She will schedule. pap smear with HR HPV obtained today Lab work done 2/19 Tdap recommended.  She will do in local PCP office. Return for PUS.  May need to consider additional screening options for pt considering Lynch syndrome in father RX for Xanax 0.5mg  po prn.  #30/1RF. return annually or prn

## 2018-04-09 NOTE — Progress Notes (Signed)
Scheduled for pelvic ultrasound 04/23/18 at 0930 and follow up with Dr. Sabra Heck at 10:00.

## 2018-04-10 LAB — CYTOLOGY - PAP
Diagnosis: NEGATIVE
HPV: NOT DETECTED

## 2018-04-23 ENCOUNTER — Ambulatory Visit (INDEPENDENT_AMBULATORY_CARE_PROVIDER_SITE_OTHER): Payer: Managed Care, Other (non HMO) | Admitting: Obstetrics & Gynecology

## 2018-04-23 ENCOUNTER — Ambulatory Visit (INDEPENDENT_AMBULATORY_CARE_PROVIDER_SITE_OTHER): Payer: Managed Care, Other (non HMO)

## 2018-04-23 VITALS — BP 108/64 | HR 68 | Ht 62.0 in | Wt 166.0 lb

## 2018-04-23 DIAGNOSIS — N852 Hypertrophy of uterus: Secondary | ICD-10-CM

## 2018-04-23 DIAGNOSIS — Z800 Family history of malignant neoplasm of digestive organs: Secondary | ICD-10-CM | POA: Diagnosis not present

## 2018-04-23 DIAGNOSIS — D251 Intramural leiomyoma of uterus: Secondary | ICD-10-CM

## 2018-04-23 NOTE — Progress Notes (Signed)
42 y.o. G2P2 Married Caucasian female here for pelvic ultrasound due to enlarged uterus that does feel changed this year.  She does have a history of fibroids.  Also, her father was diagnosed with Lynch syndrome last year and her insurance won't cover genetic testing so she has not had this done.  No LMP recorded.  Contraception: BLT  Findings:  UTERUS: 9.2 x 6.5 x 5.4cm with 1.5cm and 1.8cm intramural fibroids (slightly larger than prior testing) EMS: 10.82mm ADNEXA: Left ovary:  2.1 x 1.7 x 1.8cm with 1.4 x 4.5GY follicle       Right ovary:  3.0 x 1.8 x 2.4cm with 1.6 x 5.6LS follicle noted CUL DE SAC: no free fluid  Discussion:  Recommended proceeding with endometrial biopsy due to father's hx of Lynch and pt not having testing done at this point.  Risks of endometrial cancer discussed.  Pt reports her father's provider advised her to have a hysterectomy if she was done with child bearing.  It seems to me that genetic testing would be the best route because she would likely need her ovaries out if her testing was positive.  Advised I would be comfortable proceeding with hysterectomy if this were here decision.  Would recommend oophorectomy as well.  Asked if she has copy of father's genetic testing and she does.  Asked if I could see it as this would likely help me with recommendations for her, even if she does not proceed with genetic testing.    Declines doing biopsy today but will return for this.  Assessment:  Enlarged uterus with fibroids Ovarian follicles Father with Lynch syndrome  Plan:  Pt will provide me a copy of her father's genetic testing Advised to return for endometrial biopsy. Pt is considering hysterectomy as well.  ~20 minutes spent with patient >50% of time was in face to face discussion of above.

## 2018-04-25 ENCOUNTER — Encounter: Payer: Self-pay | Admitting: Obstetrics & Gynecology

## 2018-04-25 DIAGNOSIS — D251 Intramural leiomyoma of uterus: Secondary | ICD-10-CM | POA: Insufficient documentation

## 2018-04-25 DIAGNOSIS — Z800 Family history of malignant neoplasm of digestive organs: Secondary | ICD-10-CM | POA: Insufficient documentation

## 2019-07-30 ENCOUNTER — Encounter

## 2019-07-30 ENCOUNTER — Ambulatory Visit: Payer: Managed Care, Other (non HMO) | Admitting: Obstetrics & Gynecology

## 2019-12-20 ENCOUNTER — Other Ambulatory Visit: Payer: Self-pay

## 2019-12-20 DIAGNOSIS — R1084 Generalized abdominal pain: Secondary | ICD-10-CM | POA: Insufficient documentation

## 2019-12-20 DIAGNOSIS — R112 Nausea with vomiting, unspecified: Secondary | ICD-10-CM | POA: Diagnosis present

## 2019-12-20 DIAGNOSIS — R197 Diarrhea, unspecified: Secondary | ICD-10-CM | POA: Diagnosis not present

## 2019-12-20 DIAGNOSIS — J45909 Unspecified asthma, uncomplicated: Secondary | ICD-10-CM | POA: Diagnosis not present

## 2019-12-20 DIAGNOSIS — Z9049 Acquired absence of other specified parts of digestive tract: Secondary | ICD-10-CM | POA: Diagnosis not present

## 2019-12-20 NOTE — ED Triage Notes (Signed)
Pt to ED via EMS from home, pt states tonight around 5pm she began to have n/v/d. Pt states she has had 20 episodes of emesis. Pt also c/o cramps all over.

## 2019-12-21 ENCOUNTER — Emergency Department
Admission: EM | Admit: 2019-12-21 | Discharge: 2019-12-21 | Disposition: A | Discharge status: Home / Self Care | Payer: Managed Care, Other (non HMO) | Attending: Emergency Medicine | Admitting: Emergency Medicine

## 2019-12-21 ENCOUNTER — Emergency Department: Payer: Managed Care, Other (non HMO)

## 2019-12-21 ENCOUNTER — Encounter: Payer: Self-pay | Admitting: Radiology

## 2019-12-21 DIAGNOSIS — R197 Diarrhea, unspecified: Secondary | ICD-10-CM

## 2019-12-21 DIAGNOSIS — R112 Nausea with vomiting, unspecified: Secondary | ICD-10-CM

## 2019-12-21 LAB — COMPREHENSIVE METABOLIC PANEL
ALT: 28 U/L (ref 0–44)
AST: 32 U/L (ref 15–41)
Albumin: 4.4 g/dL (ref 3.5–5.0)
Alkaline Phosphatase: 68 U/L (ref 38–126)
Anion gap: 11 (ref 5–15)
BUN: 16 mg/dL (ref 6–20)
CO2: 24 mmol/L (ref 22–32)
Calcium: 9.5 mg/dL (ref 8.9–10.3)
Chloride: 103 mmol/L (ref 98–111)
Creatinine, Ser: 0.91 mg/dL (ref 0.44–1.00)
GFR calc Af Amer: >60 mL/min (ref >60)
GFR calc non Af Amer: >60 mL/min (ref >60)
Glucose, Bld: 130 mg/dL — ABNORMAL HIGH (ref 70–99)
Potassium: 4.2 mmol/L (ref 3.5–5.1)
Sodium: 138 mmol/L (ref 135–145)
Total Bilirubin: 0.9 mg/dL (ref 0.3–1.2)
Total Protein: 8.3 g/dL — ABNORMAL HIGH (ref 6.5–8.1)

## 2019-12-21 LAB — CBC WITH DIFFERENTIAL/PLATELET
Abs Immature Granulocytes: 0.21 10*3/uL — ABNORMAL HIGH (ref 0.00–0.07)
Basophils Absolute: 0.1 10*3/uL (ref 0.0–0.1)
Basophils Relative: 0 %
Eosinophils Absolute: 0.5 10*3/uL (ref 0.0–0.5)
Eosinophils Relative: 2 %
HCT: 43.5 % (ref 36.0–46.0)
Hemoglobin: 13.9 g/dL (ref 12.0–15.0)
Immature Granulocytes: 1 %
Lymphocytes Relative: 4 %
Lymphs Abs: 1.1 10*3/uL (ref 0.7–4.0)
MCH: 27.3 pg (ref 26.0–34.0)
MCHC: 32 g/dL (ref 30.0–36.0)
MCV: 85.5 fL (ref 80.0–100.0)
Monocytes Absolute: 1.2 10*3/uL — ABNORMAL HIGH (ref 0.1–1.0)
Monocytes Relative: 5 %
Neutro Abs: 24 10*3/uL — ABNORMAL HIGH (ref 1.7–7.7)
Neutrophils Relative %: 88 %
Platelets: 403 10*3/uL — ABNORMAL HIGH (ref 150–400)
RBC: 5.09 MIL/uL (ref 3.87–5.11)
RDW: 13.8 % (ref 11.5–15.5)
Smear Review: NORMAL
WBC: 27 10*3/uL — ABNORMAL HIGH (ref 4.0–10.5)
nRBC: 0 % (ref 0.0–0.2)

## 2019-12-21 LAB — POCT PREGNANCY, URINE: Preg Test, Ur: NEGATIVE

## 2019-12-21 LAB — URINALYSIS, COMPLETE (UACMP) WITH MICROSCOPIC
Bacteria, UA: NONE SEEN
Bilirubin Urine: NEGATIVE
Glucose, UA: NEGATIVE mg/dL
Hgb urine dipstick: NEGATIVE
Ketones, ur: 5 mg/dL — AB
Leukocytes,Ua: NEGATIVE
Nitrite: NEGATIVE
Protein, ur: 30 mg/dL — AB
Specific Gravity, Urine: 1.021 (ref 1.005–1.030)
pH: 5 (ref 5.0–8.0)

## 2019-12-21 LAB — LIPASE, BLOOD: Lipase: 24 U/L (ref 11–51)

## 2019-12-21 MED ORDER — IOHEXOL 300 MG/ML  SOLN
100.0000 mL | Freq: Once | INTRAMUSCULAR | Status: AC | PRN
  Administered 2019-12-21: 100 mL via INTRAVENOUS

## 2019-12-21 MED ORDER — ONDANSETRON HCL 4 MG/2ML IJ SOLN
4.0000 mg | Freq: Once | INTRAMUSCULAR | Status: AC
  Administered 2019-12-21: 04:00:00 4 mg via INTRAVENOUS
  Filled 2019-12-21: qty 2

## 2019-12-21 MED ORDER — ONDANSETRON 4 MG PO TBDP: 4.0000 mg | ORAL_TABLET | Freq: Three times a day (TID) | ORAL | 0 refills | Status: DC | PRN

## 2019-12-21 MED ORDER — IOHEXOL 9 MG/ML PO SOLN
500.0000 mL | Freq: Two times a day (BID) | ORAL | Status: DC | PRN
  Administered 2019-12-21: 05:00:00 500 mL via ORAL

## 2019-12-21 MED ORDER — SODIUM CHLORIDE 0.9 % IV BOLUS
1000.0000 mL | Freq: Once | INTRAVENOUS | Status: AC
  Administered 2019-12-21: 1000 mL via INTRAVENOUS

## 2019-12-21 MED ORDER — DICYCLOMINE HCL 20 MG PO TABS: 20.0000 mg | ORAL_TABLET | Freq: Four times a day (QID) | ORAL | 0 refills | Status: DC

## 2019-12-21 NOTE — ED Provider Notes (Signed)
[facility] Emergency Department Provider Note   ____________________________________________   First MD Initiated Contact with Christina Faulkner 12/21/19 0403     (approximate)  I have reviewed the triage vital signs and the nursing notes.   HISTORY  Chief Complaint Diarrhea and Emesis    HPI Christina Faulkner is a 44 y.o. female brought to the ED via EMS from home with a chief complaint of nausea/vomiting/diarrhea.  Christina Faulkner reports onset around 5 PM.  Vomiting worse than diarrhea; Christina Faulkner guesses Christina Faulkner has had 20 episodes of emesis.  Also complaining of cramps all over.  Denies abdominal pain.  Denies fever, cough, chest pain, shortness of breath.  Denies recent travel, trauma or antibiotic use.  Has had both doses of Christina Faulkner COVID-19 vaccine.       Past Medical History:  Diagnosis Date  . Asthma   . Cholelithiasis   . Diverticulosis 08/2009    Christina Faulkner Active Problem List   Diagnosis Date Noted  . Intramural leiomyoma of uterus 04/25/2018  . Family history of Lynch syndrome 04/25/2018  . Family history of colon cancer 07/27/2015  . Adjustment disorder with anxiety 05/03/2010  . Asthma with exacerbation 07/12/2009  . Calculus of gallbladder 03/15/2009  . Diverticular disease of large intestine 03/15/2009  . Asthma 10/26/2008    Past Surgical History:  Procedure Laterality Date  . COLONOSCOPY  5/10  . LAPAROSCOPIC CHOLECYSTECTOMY  1/11   hernia repair  . TUBAL LIGATION  8/03    Prior to Admission medications   Medication Sig Start Date End Date Taking? Authorizing Provider  albuterol (PROAIR HFA) 108 (90 Base) MCG/ACT inhaler INHALE 2 PUFFS INTO THE LUNGS EVERY 6 HOURS AS NEEDED FOR WHEEZING 07/16/16 12/21/19 Yes [provider]  ALPRAZolam (XANAX) 0.5 MG tablet Take 1 tablet (0.5 mg total) by mouth as needed for sleep. 04/09/18  Yes Megan Salon, MD  ibuprofen (ADVIL) 200 MG tablet Take 200 mg by mouth every 6 (six) hours as needed for fever,  headache or mild pain.   Yes [provider]  valACYclovir (VALTREX) 1000 MG tablet Take 1,000 mg by mouth See admin instructions. Take 2 tablets x 2 doses 12 hours apart at onset of symptoms. 08/27/19  Yes [provider]  dicyclomine (BENTYL) 20 MG tablet Take 1 tablet (20 mg total) by mouth every 6 (six) hours. 12/21/19   Paulette Blanch, MD  ondansetron (ZOFRAN ODT) 4 MG disintegrating tablet Take 1 tablet (4 mg total) by mouth every 8 (eight) hours as needed for nausea or vomiting. 12/21/19   Paulette Blanch, MD    Allergies Amoxicillin, Keflex [cephalexin], Penicillins, Codeine, and Sulfa antibiotics  Family History  Problem Relation Age of Onset  . Cancer Father        removed kidney-aggressive tumor in ureter  . Colon cancer Father 24       63 recurrent colon cancer  . Colon cancer Paternal Grandfather   . Diabetes Maternal Aunt   . Diabetes Maternal Grandmother   . Diabetes Maternal Grandfather   . Hypertension Maternal Grandfather   . Heart disease Maternal Grandfather   . Migraines Sister     Social History Social History   Tobacco Use  . Smoking status: Never Smoker  . Smokeless tobacco: Never Used  Substance Use Topics  . Alcohol use: No  . Drug use: No    Review of Systems  Constitutional: No fever/chills Eyes: No visual changes. ENT: No sore throat. Cardiovascular: Denies chest pain.  Respiratory: Denies shortness of breath. Gastrointestinal: No abdominal pain.  Positive for nausea, vomiting and diarrhea.  No constipation. Genitourinary: Negative for dysuria. Musculoskeletal: Negative for back pain. Skin: Negative for rash. Neurological: Negative for headaches, focal weakness or numbness.   ____________________________________________   PHYSICAL EXAM:  VITAL SIGNS: ED Triage Vitals [12/20/19 2326]  Enc Vitals Group     BP 127/88     Pulse Rate (!) 114     Resp 20     Temp 97.8 F (36.6 C)     Temp src      SpO2 100 %     Weight 170  lb (77.1 kg)     Height 5\' 2"  (1.575 m)     Head Circumference      Peak Flow      Pain Score 10     Pain Loc      Pain Edu?      Excl. in [place]?     Constitutional: Alert and oriented. Well appearing and in no acute distress. Eyes: Conjunctivae are normal. PERRL. EOMI. Head: Atraumatic. Nose: No congestion/rhinnorhea. Mouth/Throat: Mucous membranes are mildly dry. Neck: No stridor.   Cardiovascular: Normal rate, regular rhythm. Grossly normal heart sounds.  Good peripheral circulation. Respiratory: Normal respiratory effort.  No retractions. Lungs CTAB. Gastrointestinal: Soft and nontender to light or deep palpation. No distention. No abdominal bruits. No CVA tenderness. Musculoskeletal: No lower extremity tenderness nor edema.  No joint effusions. Neurologic:  Normal speech and language. No gross focal neurologic deficits are appreciated. No gait instability. Skin:  Skin is warm, dry and intact. No rash noted. Psychiatric: Mood and affect are normal. Speech and behavior are normal.  ____________________________________________   LABS (all labs ordered are listed, but only abnormal results are displayed)  Labs Reviewed  CBC WITH DIFFERENTIAL/PLATELET - Abnormal; Notable for the following components:      Result Value   WBC 27.0 (*)    Platelets 403 (*)    Neutro Abs 24.0 (*)    Monocytes Absolute 1.2 (*)    Abs Immature Granulocytes 0.21 (*)    All other components within normal limits  COMPREHENSIVE METABOLIC PANEL - Abnormal; Notable for the following components:   Glucose, Bld 130 (*)    Total Protein 8.3 (*)    All other components within normal limits  URINALYSIS, COMPLETE (UACMP) WITH MICROSCOPIC - Abnormal; Notable for the following components:   Color, Urine YELLOW (*)    APPearance CLOUDY (*)    Ketones, ur 5 (*)    Protein, ur 30 (*)    All other components within normal limits  LIPASE, BLOOD  POC URINE PREG, ED  POCT PREGNANCY, URINE    ____________________________________________  EKG  None ____________________________________________  RADIOLOGY  ED MD interpretation: Unremarkable CT abdomen/pelvis  Official radiology report(s): CT Abdomen Pelvis W Contrast  Result Date: 12/21/2019 CLINICAL DATA:  Nausea and vomiting since 5 p.m. yesterday. EXAM: CT ABDOMEN AND PELVIS WITH CONTRAST TECHNIQUE: Multidetector CT imaging of the abdomen and pelvis was performed using the standard protocol following bolus administration of intravenous contrast. CONTRAST:  167mL OMNIPAQUE IOHEXOL 300 MG/ML  SOLN COMPARISON:  None. FINDINGS: Lower chest: Insert lung bases Hepatobiliary: No focal hepatic lesions or intrahepatic biliary dilatation. The gallbladder is surgically absent. No common bile duct dilatation. Pancreas: No mass, inflammation or ductal dilatation. Spleen: Borderline splenic enlargement. The spleen measures 12.4 x 11.0 x 8.6 cm (volume 586 cubic cm). No focal lesions. Adrenals/Urinary Tract: The adrenal glands are unremarkable.  Small left renal calculi are noted but no obstructing ureteral calculi or hydroureteronephrosis. No worrisome renal lesions. The bladder is unremarkable. Stomach/Bowel: The stomach, duodenum, small bowel and colon are unremarkable. No acute inflammatory changes, mass lesions or obstructive findings. The terminal ileum appears normal. The appendix is normal. Sigmoid colon diverticulosis but no findings for acute diverticulitis. Vascular/Lymphatic: The aorta is normal in caliber. No dissection. The branch vessels are patent. The major venous structures are patent. No mesenteric or retroperitoneal mass or adenopathy. Small scattered lymph nodes are noted. Reproductive: Scattered small uterine fibroids are noted. Both ovaries are unremarkable. Other: No pelvic mass or adenopathy. No free pelvic fluid collections. No inguinal mass or adenopathy. No abdominal wall hernia or subcutaneous lesions. Musculoskeletal: No  significant bony findings. IMPRESSION: 1. No acute abdominal/pelvic findings, mass lesions or adenopathy. 2. Borderline splenic enlargement. 3. Status post cholecystectomy. No biliary dilatation. 4. Small left renal calculi but no obstructing ureteral calculi or bladder calculi. Electronically Signed   By: Marijo Sanes M.D.   On: 12/21/2019 05:33    ____________________________________________   PROCEDURES  Procedure(s) performed (including Critical Care):  Procedures   ____________________________________________   INITIAL IMPRESSION / ASSESSMENT AND PLAN / ED COURSE  As part of my medical decision making, I reviewed the following data within the [facility] notes reviewed and incorporated, Labs reviewed, Old chart reviewed and Notes from prior ED visits     AIVY SCRIVENER was evaluated in Emergency Department on 12/21/2019 for the symptoms described in the history of present illness. Christina Faulkner was evaluated in the context of the global COVID-19 pandemic, which necessitated consideration that the Christina Faulkner might be at risk for infection with the SARS-CoV-2 virus that causes COVID-19. Institutional protocols and algorithms that pertain to the evaluation of patients at risk for COVID-19 are in a state of rapid change based on information released by regulatory bodies including the CDC and federal and state organizations. These policies and algorithms were followed during the Christina Faulkner's care in the ED.    44 year old female presenting with nausea/vomiting/diarrhea. Differential diagnosis includes, but is not limited to, ovarian cyst, ovarian torsion, acute appendicitis, diverticulitis, urinary tract infection/pyelonephritis, endometriosis, bowel obstruction, colitis, renal colic, gastroenteritis, hernia, fibroids, endometriosis, pregnancy related pain including ectopic pregnancy, etc.  Will obtain basic labwork, initiate IV fluid resuscitation, IV emetic and  reassess.   Clinical Course as of Dec 20 656  Tue Dec 21, 2019  [id number] CT scan was done for Christina Faulkner's leukocytosis.  Updated Christina Faulkner and spouse on unremarkable CT scan.  Christina Faulkner is feeling significantly better and was able to tolerate oral contrast without emesis.  Will discharge home with prescriptions for Zofran and Bentyl to use as needed.  Strict return precautions given.  Both verbalized understanding agree with plan of care.   [JS]    Clinical Course User Index [JS] Paulette Blanch, MD     ____________________________________________   FINAL CLINICAL IMPRESSION(S) / ED DIAGNOSES  Final diagnoses:  Nausea vomiting and diarrhea     ED Discharge Orders         Ordered    ondansetron (ZOFRAN ODT) 4 MG disintegrating tablet  Every 8 hours PRN     12/21/19 0554    dicyclomine (BENTYL) 20 MG tablet  Every 6 hours     12/21/19 0641           Note:  This document was prepared using Dragon voice recognition software and may include unintentional dictation errors.   Beather Arbour,  Gretchen Short, MD 12/21/19 [phone]

## 2019-12-21 NOTE — ED Notes (Signed)
Pt to toilet on own power in room to provide urine sample. No difficulty noted

## 2019-12-21 NOTE — ED Notes (Signed)
Pt to CT

## 2019-12-21 NOTE — ED Notes (Signed)
Pt unconnected from monitor to allow pt to ambulate to toilet

## 2019-12-21 NOTE — ED Notes (Signed)
Pt refused a wheelchair after multiple offers.

## 2019-12-21 NOTE — Discharge Instructions (Signed)
1.  You may take Zofran as needed for nausea. 2.  Clear liquids x12 hours, then [employer] x2 days, then slowly advance diet as tolerated. 3.  Return to the ER for worsening symptoms, persistent vomiting, difficulty breathing or other concerns.

## 2019-12-21 NOTE — ED Notes (Signed)
Pt educated on plan of care, husband at bedside

## 2019-12-21 NOTE — ED Notes (Signed)
Pt resting in lobby. No emesis at this time.

## 2019-12-24 ENCOUNTER — Ambulatory Visit: Payer: Managed Care, Other (non HMO) | Admitting: Obstetrics & Gynecology

## 2020-09-16 HISTORY — PX: BIOPSY ENDOMETRIAL: PRO11

## 2020-12-26 DIAGNOSIS — Z800 Family history of malignant neoplasm of digestive organs: Secondary | ICD-10-CM | POA: Insufficient documentation

## 2021-01-02 ENCOUNTER — Other Ambulatory Visit: Payer: Self-pay

## 2021-01-02 ENCOUNTER — Ambulatory Visit (INDEPENDENT_AMBULATORY_CARE_PROVIDER_SITE_OTHER): Payer: Managed Care, Other (non HMO) | Admitting: Obstetrics & Gynecology

## 2021-01-02 ENCOUNTER — Other Ambulatory Visit (HOSPITAL_COMMUNITY)
Admission: RE | Admit: 2021-01-02 | Discharge: 2021-01-02 | Disposition: A | Discharge status: Home / Self Care | Payer: Managed Care, Other (non HMO) | Source: Ambulatory Visit | Attending: Obstetrics & Gynecology | Admitting: Obstetrics & Gynecology

## 2021-01-02 ENCOUNTER — Encounter (HOSPITAL_BASED_OUTPATIENT_CLINIC_OR_DEPARTMENT_OTHER): Payer: Self-pay | Admitting: Obstetrics & Gynecology

## 2021-01-02 VITALS — BP 137/81 | HR 73 | Ht 61.75 in | Wt 186.0 lb

## 2021-01-02 DIAGNOSIS — Z800 Family history of malignant neoplasm of digestive organs: Secondary | ICD-10-CM

## 2021-01-02 DIAGNOSIS — Z124 Encounter for screening for malignant neoplasm of cervix: Secondary | ICD-10-CM | POA: Insufficient documentation

## 2021-01-02 DIAGNOSIS — Z01419 Encounter for gynecological examination (general) (routine) without abnormal findings: Secondary | ICD-10-CM

## 2021-01-02 DIAGNOSIS — D251 Intramural leiomyoma of uterus: Secondary | ICD-10-CM

## 2021-01-02 DIAGNOSIS — D261 Other benign neoplasm of corpus uteri: Secondary | ICD-10-CM | POA: Diagnosis not present

## 2021-01-02 DIAGNOSIS — Z1211 Encounter for screening for malignant neoplasm of colon: Secondary | ICD-10-CM | POA: Insufficient documentation

## 2021-01-02 DIAGNOSIS — R7303 Prediabetes: Secondary | ICD-10-CM

## 2021-01-02 HISTORY — DX: Prediabetes: R73.03

## 2021-01-02 NOTE — Progress Notes (Signed)
45 y.o. G2P2 Married White or Caucasian female here for annual exam.  Cycles are regular.  Has faint spotting prior starting cycle.  Denies mid cycle spotting.  Flow lasts 5-6 days.  Father has hx of lynch syndrome.  She has not had genetic testing done.  We discussed this at last visit.  Also, endometrial biopsy was recommended.  She did not return for this either.  I really do feel this is something we should proceed with today.  Procedure/risks/benefits discussed.  She is ready to proceed with genetic testing.  Dr. Collene Mares has encouraged her to have this done as well.  Had CT scan about a year ago due to nausea/vomiting.  She thought it was diverticulitis but was not.  Did have a small non obstructing renal stone and borderline enlarged spleen.  Daughter is a Administrator, arts at at [employer].  She will come home and start the radiology course at [facility].  This is a two year program.  Son is a Agricultural consultant and broke up with girlfriend of several years (just yesterday) so there is some sadness with this.  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  Has been stressful two years.    Patient's last menstrual period was 01/02/2021.          Sexually active: Yes.    The current method of family planning is tubal ligation.    Exercising:No Smoker:  no  Health Maintenance: Pap:  04/09/2018 History of abnormal Pap:  no MMG:  08/31/2020 Colonoscopy:  Colonoscopy scheduled June with Dr. Collene Mares BMD:   n/a TDaP:  2017 Hep C testing: has not had done Screening Labs: done with Vicenta Aly, 12/28/2020   reports that she has never smoked. She has never used smokeless tobacco. She reports that she does not drink alcohol and does not use drugs.  Past Medical History:  Diagnosis Date  . Asthma   . Cholelithiasis   . Diverticulosis 08/2009    Past Surgical History:  Procedure Laterality Date  . COLONOSCOPY  5/10  . LAPAROSCOPIC CHOLECYSTECTOMY  1/11   hernia repair  . TUBAL LIGATION  8/03    Current  Outpatient Medications  Medication Sig Dispense Refill  . ALPRAZolam (XANAX) 0.5 MG tablet Take 1 tablet (0.5 mg total) by mouth as needed for sleep. 30 tablet 1  . ibuprofen (ADVIL) 200 MG tablet Take 200 mg by mouth every 6 (six) hours as needed for fever, headache or mild pain.    Marland Kitchen ondansetron (ZOFRAN ODT) 4 MG disintegrating tablet Take 1 tablet (4 mg total) by mouth every 8 (eight) hours as needed for nausea or vomiting. 20 tablet 0  . valACYclovir (VALTREX) 1000 MG tablet Take 1,000 mg by mouth See admin instructions. Take 2 tablets x 2 doses 12 hours apart at onset of symptoms.    Marland Kitchen albuterol (PROAIR HFA) 108 (90 Base) MCG/ACT inhaler INHALE 2 PUFFS INTO THE LUNGS EVERY 6 HOURS AS NEEDED FOR WHEEZING    . dicyclomine (BENTYL) 20 MG tablet Take 1 tablet (20 mg total) by mouth every 6 (six) hours. (Patient not taking: Reported on 01/02/2021) 20 tablet 0   No current facility-administered medications for this visit.    Family History  Problem Relation Age of Onset  . Cancer Father        removed kidney-aggressive tumor in ureter  . Colon cancer Father 24       63 recurrent colon cancer  . Colon cancer Paternal Grandfather   . Diabetes Maternal  Aunt   . Diabetes Maternal Grandmother   . Diabetes Maternal Grandfather   . Hypertension Maternal Grandfather   . Heart disease Maternal Grandfather   . Migraines Sister     Review of Systems  All other systems reviewed and are negative.   Exam:   BP 137/81   Pulse 73   Ht 5' 1.75" (1.568 m)   Wt 186 lb (84.4 kg)   LMP 01/02/2021   BMI 34.30 kg/m   Height: 5' 1.75" (156.8 cm)  General appearance: alert, cooperative and appears stated age Head: Normocephalic, without obvious abnormality, atraumatic Neck: no adenopathy, supple, symmetrical, trachea midline and thyroid normal to inspection and palpation Lungs: clear to auscultation bilaterally Breasts: normal appearance, no masses or tenderness Heart: regular rate and  rhythm Abdomen: soft, non-tender; bowel sounds normal; no masses,  no organomegaly Extremities: extremities normal, atraumatic, no cyanosis or edema Skin: Skin color, texture, turgor normal. No rashes or lesions Lymph nodes: Cervical, supraclavicular, and axillary nodes normal. No abnormal inguinal nodes palpated Neurologic: Grossly normal   Pelvic: External genitalia:  no lesions              Urethra:  normal appearing urethra with no masses, tenderness or lesions              Bartholins and Skenes: normal                 Vagina: normal appearing vagina with normal color and discharge, no lesions              Cervix: no lesions              Pap taken: Yes.   Bimanual Exam:  Uterus:  normal size, contour, position, consistency, mobility, non-tender              Adnexa: normal adnexa and no mass, fullness, tenderness               Rectovaginal: Confirms               Anus:  normal sphincter tone, no lesions  Endometrial biopsy recommended.  Discussed with patient.  Verbal and written consent obtained.   Procedure:  Speculum placed.  Cervix visualized and cleansed with betadine prep.  A single toothed tenaculum was not applied to the anterior lip of the cervix.  Endometrial pipelle was advanced through the cervix into the endometrial cavity without difficulty.  Pipelle passed to 8cm.  Suction applied and pipelle removed with good tissue sample obtained.  Tenculum removed.  No bleeding noted.  Patient tolerated procedure well.  Chaperone, Prince Rome, CMA, was present for exam.  Assessment/Plan: 1. Well woman exam with routine gynecological exam - pap and HR HPV obtained today - MMG 12/21 - colonoscopy scheduled in June with Dr. Collene Mares - vaccines reviewed - blood work done with Vicenta Aly, FNP  2. Family history of colon cancer  3. Family history of Lynch syndrome - Surgical pathology( [place]/ [facility]) - will proceed with referral to genetic counselor for testing.  Pt  does have father's results  4. Intramural leiomyoma of uterus

## 2021-01-03 LAB — SURGICAL PATHOLOGY

## 2021-01-04 LAB — CYTOLOGY - PAP
Comment: NEGATIVE
Diagnosis: NEGATIVE
High risk HPV: NEGATIVE

## 2021-01-11 DIAGNOSIS — R0609 Other forms of dyspnea: Secondary | ICD-10-CM

## 2021-01-11 DIAGNOSIS — R011 Cardiac murmur, unspecified: Secondary | ICD-10-CM | POA: Insufficient documentation

## 2021-01-11 HISTORY — DX: Other forms of dyspnea: R06.09

## 2021-02-14 HISTORY — PX: COLONOSCOPY: SHX174

## 2021-02-15 ENCOUNTER — Encounter (HOSPITAL_BASED_OUTPATIENT_CLINIC_OR_DEPARTMENT_OTHER): Payer: Self-pay | Admitting: Obstetrics & Gynecology

## 2021-05-04 ENCOUNTER — Encounter (HOSPITAL_BASED_OUTPATIENT_CLINIC_OR_DEPARTMENT_OTHER): Payer: Self-pay

## 2022-01-19 IMAGING — CT CT ABD-PELV W/ CM
2 of 5 series · 16 of 46 positions shown, 18 images · IV contrast (APPLIED)
Comparison: None.

CLINICAL DATA: Nausea and vomiting since 5 p.m. yesterday.

EXAM:
CT ABDOMEN AND PELVIS WITH CONTRAST
TECHNIQUE: Multidetector CT imaging of the abdomen and pelvis was performed
using the standard protocol following bolus administration of
intravenous contrast.
CONTRAST:  100mL OMNIPAQUE IOHEXOL 300 MG/ML  SOLN

[Series 2: routine abd/pel with · axial · 0.68mm/px · z∈[-1104,-689]mm · 13 of 93 slices shown, 15 images]
[im 5/93  soft-tissue]
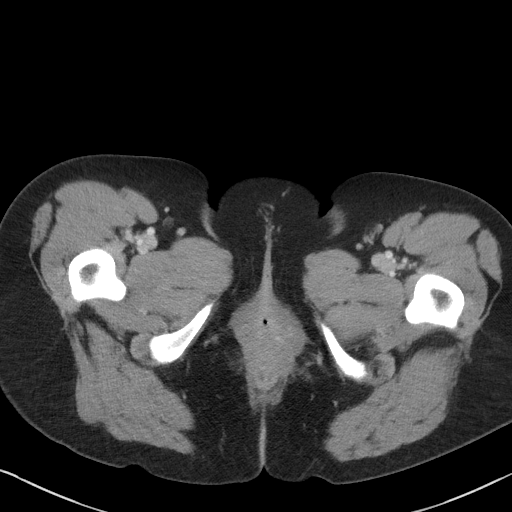
[im 5/93  bone]
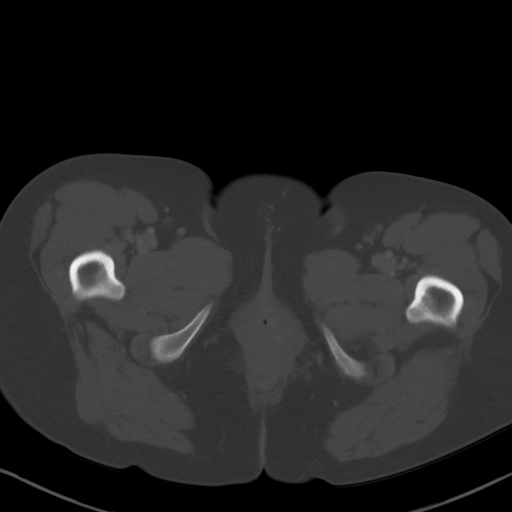
[im 15/93  soft-tissue]
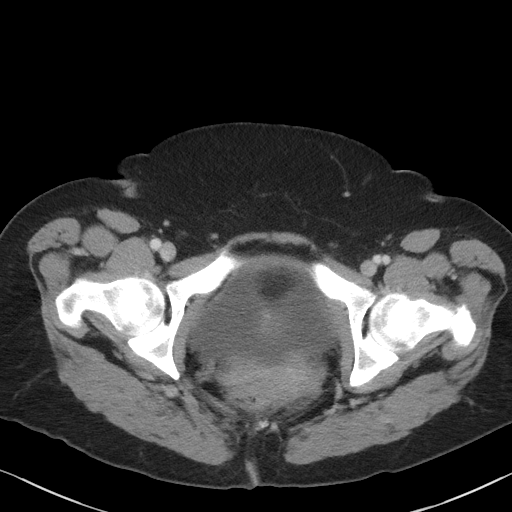
[im 20/93  soft-tissue]
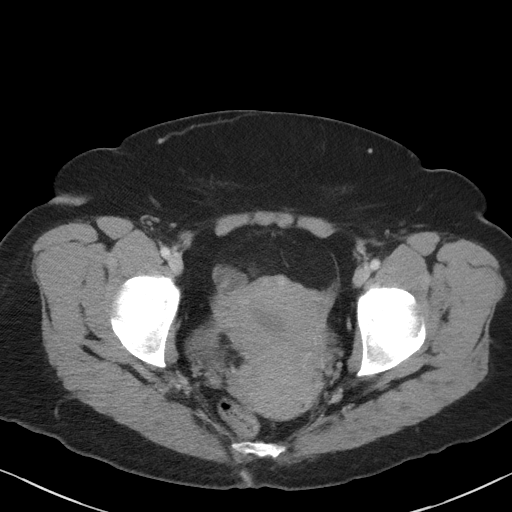
[im 25/93  soft-tissue]
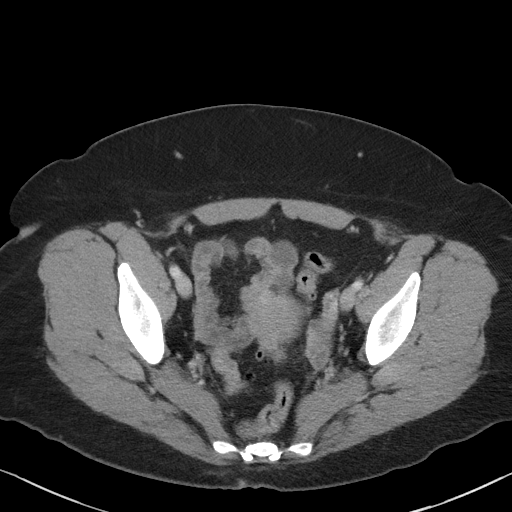
[im 34/93  soft-tissue]
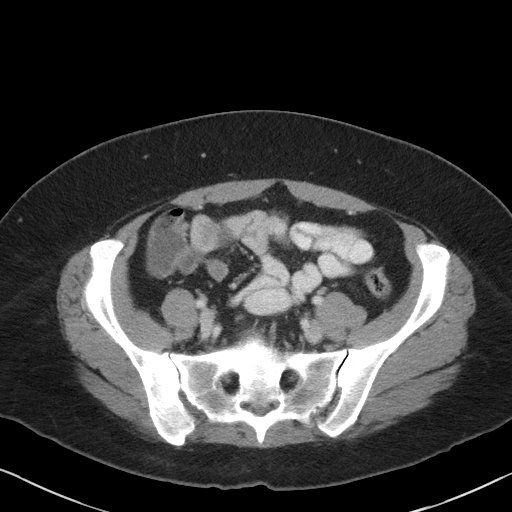
[im 39/93  soft-tissue]
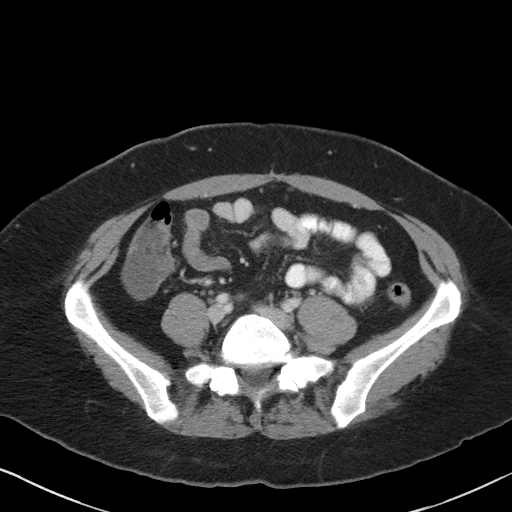
[im 49/93  soft-tissue]
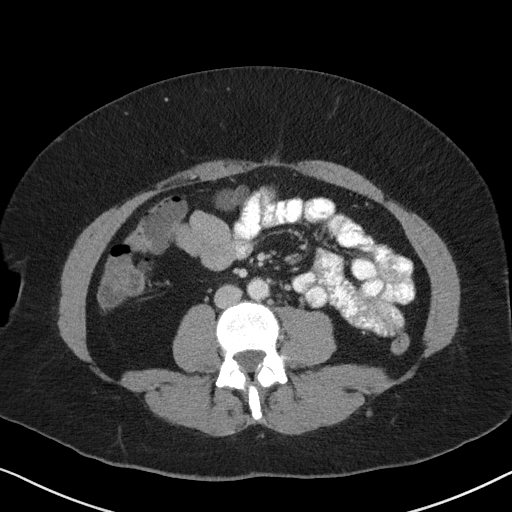
[im 54/93  soft-tissue]
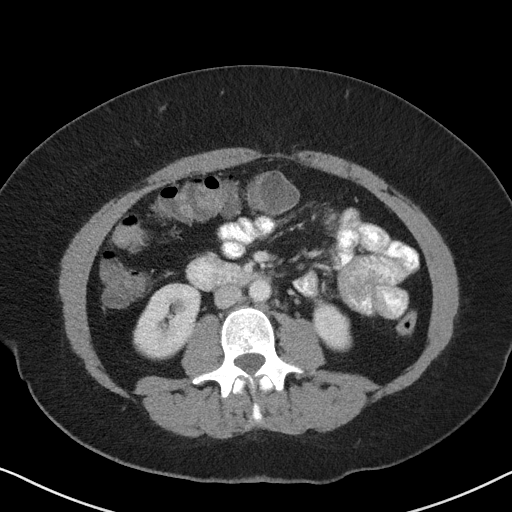
[im 59/93  soft-tissue]
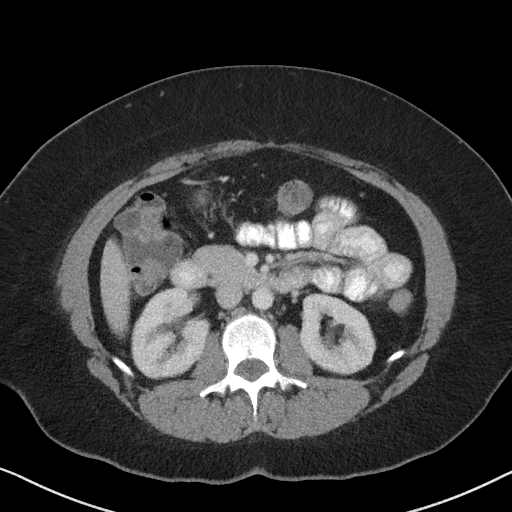
[im 59/93  bone]
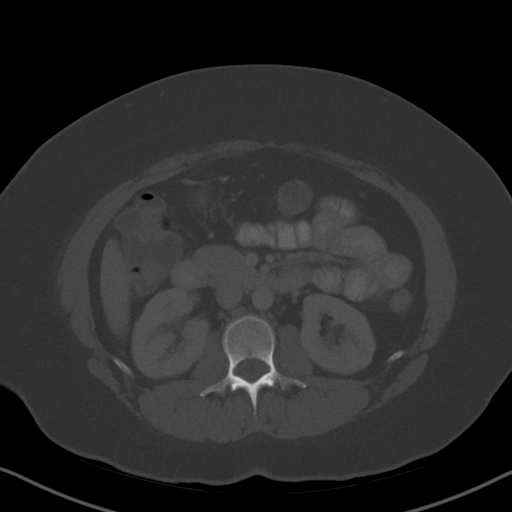
[im 68/93  soft-tissue]
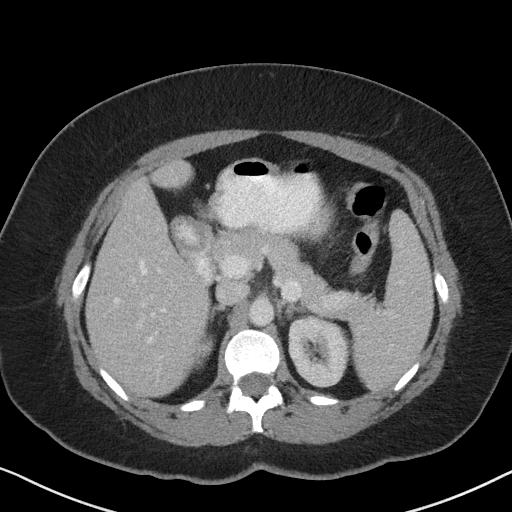
[im 73/93  soft-tissue]
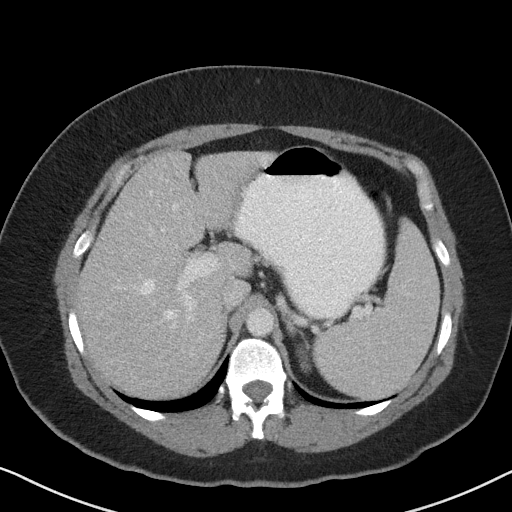
[im 78/93  soft-tissue]
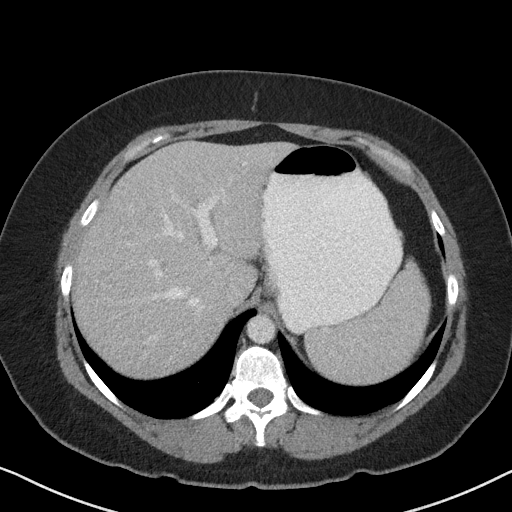
[im 88/93  soft-tissue]
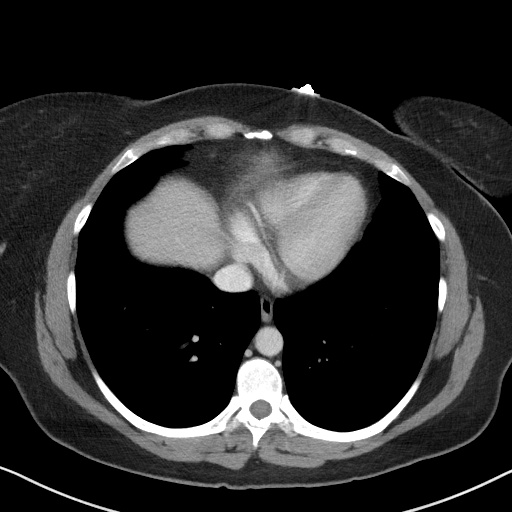

[Series 5: coronal st · coronal · 0.71mm/px · 3 of 88 slices shown]
[im 30/88  soft-tissue]
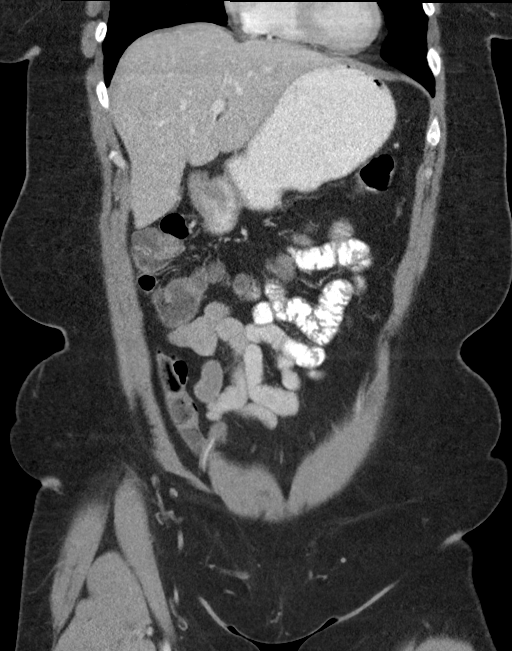
[im 39/88  soft-tissue]
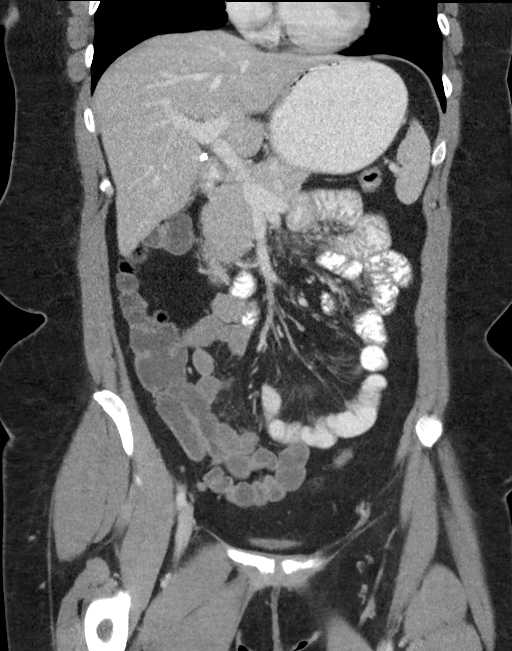
[im 49/88  soft-tissue]
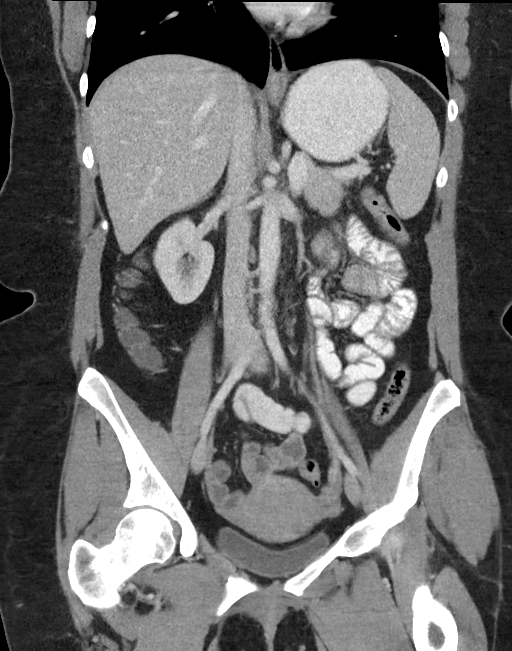

[16 of 46 positions shown; findings below may reference images not displayed]

FINDINGS: Lower chest: Insert lung bases

Hepatobiliary: No focal hepatic lesions or intrahepatic biliary
dilatation. The gallbladder is surgically absent. No common bile
duct dilatation.

Pancreas: No mass, inflammation or ductal dilatation.

Spleen: Borderline splenic enlargement. The spleen measures 12.4 x
11.0 x 8.6 cm (volume 586 cubic cm). No focal lesions.

Adrenals/Urinary Tract: The adrenal glands are unremarkable.

Small left renal calculi are noted but no obstructing ureteral
calculi or hydroureteronephrosis. No worrisome renal lesions. The
bladder is unremarkable.

Stomach/Bowel: The stomach, duodenum, small bowel and colon are
unremarkable. No acute inflammatory changes, mass lesions or
obstructive findings. The terminal ileum appears normal. The
appendix is normal.

Sigmoid colon diverticulosis but no findings for acute
diverticulitis.

Vascular/Lymphatic: The aorta is normal in caliber. No dissection.
The branch vessels are patent. The major venous structures are
patent. No mesenteric or retroperitoneal mass or adenopathy. Small
scattered lymph nodes are noted.

Reproductive: Scattered small uterine fibroids are noted. Both
ovaries are unremarkable.

Other: No pelvic mass or adenopathy. No free pelvic fluid
collections. No inguinal mass or adenopathy. No abdominal wall
hernia or subcutaneous lesions.

Musculoskeletal: No significant bony findings.
IMPRESSION: 1. No acute abdominal/pelvic findings, mass lesions or adenopathy.
2. Borderline splenic enlargement.
3. Status post cholecystectomy. No biliary dilatation.
4. Small left renal calculi but no obstructing ureteral calculi or
bladder calculi.

## 2022-05-01 ENCOUNTER — Encounter (HOSPITAL_BASED_OUTPATIENT_CLINIC_OR_DEPARTMENT_OTHER): Payer: Self-pay | Admitting: Obstetrics & Gynecology

## 2022-05-01 ENCOUNTER — Ambulatory Visit (INDEPENDENT_AMBULATORY_CARE_PROVIDER_SITE_OTHER): Payer: Managed Care, Other (non HMO) | Admitting: Obstetrics & Gynecology

## 2022-05-01 VITALS — BP 135/78 | HR 66 | Ht 61.75 in | Wt 162.4 lb

## 2022-05-01 DIAGNOSIS — Z800 Family history of malignant neoplasm of digestive organs: Secondary | ICD-10-CM | POA: Diagnosis not present

## 2022-05-01 DIAGNOSIS — D251 Intramural leiomyoma of uterus: Secondary | ICD-10-CM | POA: Diagnosis not present

## 2022-05-01 DIAGNOSIS — Z01419 Encounter for gynecological examination (general) (routine) without abnormal findings: Secondary | ICD-10-CM

## 2022-05-01 NOTE — Progress Notes (Signed)
46 y.o. G2P2 Married White or Caucasian female here for annual exam.  Father with hx of Lynch.  She has not done genetic testing but willing to do this.  Her father's physicians at [facility] have really recommended she have surgery if no genetic testing.  Had negative pap smear last year and negative endometrial biopsy.    Menstrual cycles are not quite as regular as they have been.  Flow lasts 5-7 days.  Flow is similar.    We did discuss hysterectomy if positive genetic testing with [facility] and then starting HRT.  Patient's last menstrual period was 03/26/2022 (exact date).          Sexually active: Yes.    The current method of family planning is tubal ligation.    Exercising: Yes.     Walking Smoker:  no  Health Maintenance: Pap:  01/02/21 neg History of abnormal Pap:  no MMG:  01/07/22 Colonoscopy:  02/14/21, Dr. Collene Mares.  Negative colonoscopy. BMD:   not indicated Screening Labs: has blood work this morning   reports that she has never smoked. She has never used smokeless tobacco. She reports that she does not drink alcohol and does not use drugs.  Past Medical History:  Diagnosis Date   Asthma    Cholelithiasis    Diverticulosis 08/2009    Past Surgical History:  Procedure Laterality Date   COLONOSCOPY  5/10   LAPAROSCOPIC CHOLECYSTECTOMY  1/11   hernia repair   TUBAL LIGATION  8/03    Current Outpatient Medications  Medication Sig Dispense Refill   ALPRAZolam (XANAX) 0.5 MG tablet Take 1 tablet (0.5 mg total) by mouth as needed for sleep. 30 tablet 1   ibuprofen (ADVIL) 200 MG tablet Take 200 mg by mouth every 6 (six) hours as needed for fever, headache or mild pain.     montelukast (SINGULAIR) 10 MG tablet Take 1 tablet by mouth daily.     ondansetron (ZOFRAN ODT) 4 MG disintegrating tablet Take 1 tablet (4 mg total) by mouth every 8 (eight) hours as needed for nausea or vomiting. 20 tablet 0   WEGOVY 2.4 MG/0.75ML SOAJ Inject into the skin.     ADVAIR DISKUS 250-50 MCG/ACT AEPB  Inhale 1 puff into the lungs 2 (two) times daily.     albuterol (PROAIR HFA) 108 (90 Base) MCG/ACT inhaler INHALE 2 PUFFS INTO THE LUNGS EVERY 6 HOURS AS NEEDED FOR WHEEZING     No current facility-administered medications for this visit.    Family History  Problem Relation Age of Onset   Cancer Father        removed kidney-aggressive tumor in ureter   Colon cancer Father 24       63 recurrent colon cancer   Colon cancer Paternal Grandfather    Diabetes Maternal Aunt    Diabetes Maternal Grandmother    Diabetes Maternal Grandfather    Hypertension Maternal Grandfather    Heart disease Maternal Grandfather    Migraines Sister     ROS: Constitutional: negative Genitourinary:negative  Exam:   BP 135/78   Pulse 66   Ht 5' 1.75" (1.568 m)   Wt 162 lb 6.4 oz (73.7 kg)   LMP 03/26/2022 (Exact Date)   BMI 29.94 kg/m   Height: 5' 1.75" (156.8 cm)  General appearance: alert, cooperative and appears stated age Head: Normocephalic, without obvious abnormality, atraumatic Neck: no adenopathy, supple, symmetrical, trachea midline and thyroid normal to inspection and palpation Lungs: clear to auscultation bilaterally Breasts: normal appearance,  no masses or tenderness Heart: regular rate and rhythm Abdomen: soft, non-tender; bowel sounds normal; no masses,  no organomegaly Extremities: extremities normal, atraumatic, no cyanosis or edema Skin: Skin color, texture, turgor normal. No rashes or lesions Lymph nodes: Cervical, supraclavicular, and axillary nodes normal. No abnormal inguinal nodes palpated Neurologic: Grossly normal   Pelvic: External genitalia:  no lesions              Urethra:  normal appearing urethra with no masses, tenderness or lesions              Bartholins and Skenes: normal                 Vagina: normal appearing vagina with normal color and no discharge, no lesions              Cervix: no lesions              Pap taken: No. Bimanual Exam:  Uterus:  normal  size, contour, position, consistency, mobility, non-tender              Adnexa: normal adnexa and no mass, fullness, tenderness               Rectovaginal: Confirms               Anus:  normal sphincter tone, no lesions  Chaperone, Ezekiel Ina, RN, was present for exam.  Assessment/Plan: 1. Well woman exam with routine gynecological exam - Pap smear neg with neg HR HPV - Mammogram 12/2021 - Colonoscopy 02/2021 - lab work done done with PCP - vaccines reviewed/updated  2. Family history of Lynch syndrome - Ambulatory referral to Genetics  3. Family history of colon cancer  4. Intramural leiomyoma of uterus - will start surgical planning

## 2022-05-01 NOTE — Patient Instructions (Signed)
[facility] Address: [address] [place], [place] [address] Phone: [phone] Appointments: [email or web address]

## 2022-05-06 ENCOUNTER — Encounter (HOSPITAL_BASED_OUTPATIENT_CLINIC_OR_DEPARTMENT_OTHER): Payer: Self-pay | Admitting: Obstetrics & Gynecology

## 2022-05-09 ENCOUNTER — Telehealth: Payer: Self-pay

## 2022-05-09 NOTE — Telephone Encounter (Signed)
Called patient to discuss potential surgery dates, patient opted for 09/27

## 2022-05-13 ENCOUNTER — Encounter (HOSPITAL_BASED_OUTPATIENT_CLINIC_OR_DEPARTMENT_OTHER): Payer: Self-pay

## 2022-05-17 ENCOUNTER — Other Ambulatory Visit (HOSPITAL_BASED_OUTPATIENT_CLINIC_OR_DEPARTMENT_OTHER): Payer: Self-pay | Admitting: Obstetrics & Gynecology

## 2022-05-17 DIAGNOSIS — Z800 Family history of malignant neoplasm of digestive organs: Secondary | ICD-10-CM

## 2022-05-17 DIAGNOSIS — D251 Intramural leiomyoma of uterus: Secondary | ICD-10-CM

## 2022-05-17 DIAGNOSIS — N920 Excessive and frequent menstruation with regular cycle: Secondary | ICD-10-CM

## 2022-05-17 DIAGNOSIS — Z01818 Encounter for other preprocedural examination: Secondary | ICD-10-CM

## 2022-05-23 ENCOUNTER — Other Ambulatory Visit: Payer: Self-pay

## 2022-05-23 ENCOUNTER — Encounter (HOSPITAL_BASED_OUTPATIENT_CLINIC_OR_DEPARTMENT_OTHER): Payer: Self-pay | Admitting: Obstetrics & Gynecology

## 2022-05-23 NOTE — Progress Notes (Signed)
Your procedure is scheduled on Wednesday, 06/12/22.  Report to [facility]   Call this number if you have problems the morning of surgery  :[phone].   OUR ADDRESS IS [place].  WE ARE LOCATED IN [facility].  PLEASE BRING YOUR INSURANCE CARD AND PHOTO ID DAY OF SURGERY.  ONLY 2 PEOPLE ARE ALLOWED IN  WAITING  ROOM.                                      REMEMBER:  DO NOT EAT FOOD, CANDY GUM OR MINTS  AFTER MIDNIGHT THE NIGHT BEFORE YOUR SURGERY . YOU MAY HAVE CLEAR LIQUIDS FROM MIDNIGHT THE NIGHT BEFORE YOUR SURGERY UNTIL  8:30 AM. NO CLEAR LIQUIDS AFTER   8:30 AM DAY OF SURGERY.  YOU MAY  BRUSH YOUR TEETH MORNING OF SURGERY AND RINSE YOUR MOUTH OUT, NO CHEWING GUM CANDY OR MINTS.     CLEAR LIQUID DIET   Foods Allowed                                                                     Foods Excluded  Coffee and tea, regular and decaf                             liquids that you cannot  Plain Jell-O                                                                   see through such as: Fruit ices (not with fruit pulp)                                     milk, soups, orange juice  Plain  Popsicles                                    All solid food Carbonated beverages, regular and diet                                    Cranberry, grape and apple juices Sports drinks like Gatorade _____________________________________________________________________     TAKE THESE MEDICATIONS MORNING OF SURGERY: Advair inhaler, Albuterol inhaler if needed, Zofran if needed  Skip the weekly dose of Wegovy on the Sunday before surgery. You may resume taking Wegovy on the Sunday after surgery.   UP TO 4 VISITORS  MAY VISIT IN THE EXTENDED RECOVERY ROOM UNTIL 800 PM ONLY.  ONE  VISITOR AGE 35 AND OVER MAY SPEND THE NIGHT AND MUST BE IN EXTENDED RECOVERY ROOM NO LATER THAN 800 PM . YOUR DISCHARGE  TIME AFTER YOU SPEND THE NIGHT IS 900 AM  THE MORNING AFTER YOUR SURGERY.  YOU MAY PACK A SMALL OVERNIGHT BAG WITH TOILETRIES FOR YOUR OVERNIGHT STAY IF YOU WISH.  YOUR PRESCRIPTION MEDICATIONS WILL BE PROVIDED DURING [place].                                      DO NOT WEAR JEWERLY, MAKE UP. DO NOT WEAR LOTIONS, POWDERS, PERFUMES OR NAIL POLISH ON YOUR FINGERNAILS. TOENAIL POLISH IS OK TO WEAR. DO NOT SHAVE FOR 48 HOURS PRIOR TO DAY OF SURGERY. MEN MAY SHAVE FACE AND NECK. CONTACTS, GLASSES, OR DENTURES MAY NOT BE WORN TO SURGERY.  REMEMBER: NO SMOKING, DRUGS OR ALCOHOL FOR 24 HOURS BEFORE YOUR SURGERY.                                    [facility] IS NOT RESPONSIBLE  FOR ANY BELONGINGS.                                                                    Marland Kitchen           [facility] - Preparing for Surgery Before surgery, you can play an important role.  Because skin is not sterile, your skin needs to be as free of germs as possible.  You can reduce the number of germs on your skin by washing with CHG (chlorahexidine gluconate) soap before surgery.  CHG is an antiseptic cleaner which kills germs and bonds with the skin to continue killing germs even after washing. Please DO NOT use if you have an allergy to CHG or antibacterial soaps.  If your skin becomes reddened/irritated stop using the CHG and inform your nurse when you arrive at Short Stay. Do not shave (including legs and underarms) for at least 48 hours prior to the first CHG shower.  You may shave your face/neck. Please follow these instructions carefully:  1.  Shower with CHG Soap the night before surgery and the  morning of Surgery.  2.  If you choose to wash your hair, wash your hair first as usual with your  normal  shampoo.  3.  After you shampoo, rinse your hair and body thoroughly to remove the  shampoo.                            4.  Use CHG as you would any other liquid soap.  You can apply chg directly  to the skin and wash , please wash your belly button  thoroughly with chg soap provided night before and morning of your surgery.                     Gently with a scrungie or clean washcloth.  5.  Apply the CHG Soap to your body ONLY FROM THE NECK DOWN.   Do not use on face/ open  Wound or open sores. Avoid contact with eyes, ears mouth and genitals (private parts).                       Wash face,  Genitals (private parts) with your normal soap.             6.  Wash thoroughly, paying special attention to the area where your surgery  will be performed.  7.  Thoroughly rinse your body with warm water from the neck down.  8.  DO NOT shower/wash with your normal soap after using and rinsing off  the CHG Soap.                9.  Pat yourself dry with a clean towel.            10.  Wear clean pajamas.            11.  Place clean sheets on your bed the night of your first shower and do not  sleep with pets. Day of Surgery : Do not apply any lotions/deodorants the morning of surgery.  Please wear clean clothes to the hospital/surgery center.  IF YOU HAVE ANY SKIN IRRITATION OR PROBLEMS WITH THE SURGICAL SOAP, PLEASE GET A BAR OF GOLD DIAL SOAP AND SHOWER THE NIGHT BEFORE YOUR SURGERY AND THE MORNING OF YOUR SURGERY. PLEASE LET THE NURSE KNOW MORNING OF YOUR SURGERY IF YOU HAD ANY PROBLEMS WITH THE SURGICAL SOAP.   ________________________________________________________________________                                                        QUESTIONS [facility] PRE OP NURSE PHONE [phone].

## 2022-05-23 NOTE — Progress Notes (Addendum)
Spoke w/ via phone for pre-op interview---Christina Faulkner needs dos----urine pregnancy               Faulkner results------06/07/22 Faulkner appt for cbc, bmp, type & screen COVID test -----patient states asymptomatic no test needed Arrive at -------0930 on Wednesday, 06/12/22. NPO after MN NO Solid Food.  Clear liquids from MN until---0830 Med rec completed Medications to take morning of surgery -----Advair, Albuterol inhaler prn, Zofran prn Diabetic medication -----Skip the weekly dose of Wegovy on the Sunday before surgery. Resume Wegovy on the Sunday after surgery. Patient instructed no nail polish to be worn day of surgery Patient instructed to bring photo id and insurance card day of surgery Patient aware to have Driver (ride ) / caregiver    for 24 hours after surgery - husband, Christina Faulkner Patient Special Instructions -----Extended / overnight stay instructions given. Patient instructed to bring Albuterol inhaler. Pre-Op special Istructions -----none Patient verbalized understanding of instructions that were given at this phone interview. Patient denies shortness of breath, chest pain, fever, cough at this phone interview.

## 2022-05-30 ENCOUNTER — Other Ambulatory Visit (HOSPITAL_COMMUNITY)
Admission: RE | Admit: 2022-05-30 | Discharge: 2022-05-30 | Disposition: A | Discharge status: Home / Self Care | Payer: Managed Care, Other (non HMO) | Source: Ambulatory Visit

## 2022-05-30 ENCOUNTER — Encounter (HOSPITAL_BASED_OUTPATIENT_CLINIC_OR_DEPARTMENT_OTHER): Payer: Self-pay | Admitting: Obstetrics & Gynecology

## 2022-05-30 ENCOUNTER — Ambulatory Visit (INDEPENDENT_AMBULATORY_CARE_PROVIDER_SITE_OTHER): Payer: Managed Care, Other (non HMO) | Admitting: Obstetrics & Gynecology

## 2022-05-30 ENCOUNTER — Ambulatory Visit (HOSPITAL_BASED_OUTPATIENT_CLINIC_OR_DEPARTMENT_OTHER)
Admission: RE | Admit: 2022-05-30 | Discharge: 2022-05-30 | Disposition: A | Discharge status: Home / Self Care | Payer: Managed Care, Other (non HMO) | Source: Ambulatory Visit | Attending: Obstetrics & Gynecology | Admitting: Obstetrics & Gynecology

## 2022-05-30 VITALS — BP 123/76 | HR 72 | Ht 62.5 in | Wt 158.0 lb

## 2022-05-30 DIAGNOSIS — Z800 Family history of malignant neoplasm of digestive organs: Secondary | ICD-10-CM | POA: Insufficient documentation

## 2022-05-30 DIAGNOSIS — N920 Excessive and frequent menstruation with regular cycle: Secondary | ICD-10-CM | POA: Insufficient documentation

## 2022-05-30 DIAGNOSIS — D251 Intramural leiomyoma of uterus: Secondary | ICD-10-CM | POA: Diagnosis present

## 2022-05-30 NOTE — Progress Notes (Signed)
46 y.o. G2P2 Married White or Caucasian female here for discussion of upcoming procedure.  TLH/BSO/cystoscopy with cytological washings planned due to menorrhagia, h/o fibroids.  Pt is undergoing testing for Lynch syndrome in November.  Has decided she does want ovaries removed as well and then we will proceed with starting HRT.  Pre-op evaluation thus far has included ultrasound done this morning.  Reviewed results with pt.  Endometrium 70m.  Endometrial biopsy recommended today.     Procedure discussed with patient.  Hospital stay, recovery and pain management all discussed.  Risks discussed including but not limited to bleeding, 1% risk of receiving a  transfusion, infection, 3-4% risk of bowel/bladder/ureteral/vascular injury discussed as well as possible need for additional surgery if injury does occur discussed.  DVT/PE and rare risk of death discussed.  My actual complications with prior surgeries discussed.  Vaginal cuff dehiscence discussed.  Hernia formation discussed.  Positioning and incision locations discussed.  Patient aware if pathology abnormal she may need additional treatment.  All questions answered.     Ob Hx:   Patient's last menstrual period was 05/30/2022 (exact date).          Sexually active: Yes.   Birth control: bilateral tubal ligation Last pap: 01/02/2021 neg with neg HR HPV Last MMG: 01/07/2022 Smoking: No   Past Surgical History:  Procedure Laterality Date   BIOPSY ENDOMETRIAL  2022   COLONOSCOPY  01/14/2009   diverticulosis, Dr. MCollene Mares  COLONOSCOPY  02/14/2021   diverticulosis, Dr. MCollene Mares  LAPAROSCOPIC CHOLECYSTECTOMY  09/16/2009   hernia repair   TUBAL LIGATION  04/16/2002    Past Medical History:  Diagnosis Date   Asthma    last asthma exacerbation about 6 months ago per pt as of 05/23/22, treated w/ prednisone. Patient uses advair inhaler twice daily and albuterol inhaler  as needed, pt states that she has been using it  about once a week in summer per pt.  Follows with TVicenta Aly NP.   Cholelithiasis 2011   Diverticulosis 08/16/2009   hx of mild diverticulitis   DOE (dyspnea on exertion) 01/11/2021   See OV note from Dr. GMarina Goodell cardiology[place] to recent asthma exacerbation and heart mumur. 01/26/21 Echo [place] 60%.   Heart murmur    01/26/21 echocardiogram in [facility], EF 60%   Menorrhagia    with regular cycle   Prediabetes 01/02/2021   HgA1c 5.9 on 01/02/21, Pt is currently taking Wegovy as of 05/22/22. Last HgbA1c on 05/01/22 was 5.1.   Uterine leiomyoma    Wears contact lenses    Wears glasses     Allergies: Amoxicillin, Keflex [cephalexin], Penicillins, Codeine, and Sulfa antibiotics  Current Outpatient Medications  Medication Sig Dispense Refill   ADVAIR DISKUS 250-50 MCG/ACT AEPB Inhale 1 puff into the lungs 2 (two) times daily.     ALPRAZolam (XANAX) 0.5 MG tablet Take 1 tablet (0.5 mg total) by mouth as needed for sleep. 30 tablet 1   ibuprofen (ADVIL) 200 MG tablet Take 200 mg by mouth every 6 (six) hours as needed for fever, headache or mild pain.     montelukast (SINGULAIR) 10 MG tablet Take 1 tablet by mouth at bedtime.     ondansetron (ZOFRAN ODT) 4 MG disintegrating tablet Take 1 tablet (4 mg total) by mouth every 8 (eight) hours as needed for nausea or vomiting. 20 tablet 0   WEGOVY 2.4 MG/0.75ML SOAJ Inject into the skin. Takes on Monday.  albuterol (PROAIR HFA) 108 (90 Base) MCG/ACT inhaler every 4 (four) hours as needed.     No current facility-administered medications for this visit.    ROS: Pertinent items noted in HPI and remainder of comprehensive ROS otherwise negative.  Exam:   BP 123/76 (BP Location: Right Arm, Patient Position: Sitting, Cuff Size: Large)   Pulse 72   Ht 5' 2.5" (1.588 m) Comment: Reported  Wt 158 lb (71.7 kg)   LMP 05/30/2022 (Exact Date)   BMI 28.44 kg/m   General appearance: alert and cooperative Head: Normocephalic, without obvious  abnormality, atraumatic Neck: no adenopathy, supple, symmetrical, trachea midline and thyroid not enlarged, symmetric, no tenderness/mass/nodules Lungs: clear to auscultation bilaterally Heart: regular rate and rhythm, S1, S2 normal, no murmur, click, rub or gallop Abdomen: soft, non-tender; bowel sounds normal; no masses,  no organomegaly Extremities: extremities normal, atraumatic, no cyanosis or edema Skin: Skin color, texture, turgor normal. No rashes or lesions Lymph nodes: Cervical, supraclavicular, and axillary nodes normal. no inguinal nodes palpated Neurologic: Grossly normal  Pelvic: External genitalia:  no lesions              Urethra: normal appearing urethra with no masses, tenderness or lesions              Bartholins and Skenes: normal                 Vagina: normal appearing vagina with normal color and discharge, no lesions              Cervix: normal appearance and with active bleeding today              Pap taken: No.        Bimanual Exam:  Uterus: globular about 8-10 weeks size                                      Adnexa:    no masses                                       Endometrial biopsy recommended.  Discussed with patient.  Verbal and written consent obtained.   Procedure:  Speculum placed.  Cervix visualized and cleansed with betadine prep.  A single toothed tenaculum was applied to the anterior lip of the cervix.  Milex dilator used to dilate cervix.  Endometrial pipelle was advanced through the cervix into the endometrial cavity without difficulty.  Pipelle passed to 7.5cm.  Suction applied and pipelle removed with good tissue sample obtained.  Tenculum removed.  No bleeding noted.  Patient tolerated procedure well.  Chaperone, Tonya Devers< CM, present for examination.   Assessment/Plan: 1. Menorrhagia with regular cycle - Surgical pathology( [place]/ [facility]) - Pre and post op instructions reviewed - Post op pain medications reviewed -  Medications/Vitamins reviewed.    2. Intramural leiomyoma of uterus  3. Family history of Lynch syndrome - pt is scheduled for testing in November

## 2022-05-31 LAB — SURGICAL PATHOLOGY

## 2022-06-06 ENCOUNTER — Encounter (HOSPITAL_BASED_OUTPATIENT_CLINIC_OR_DEPARTMENT_OTHER): Payer: Self-pay | Admitting: Obstetrics & Gynecology

## 2022-06-07 ENCOUNTER — Encounter (HOSPITAL_COMMUNITY)
Admission: RE | Admit: 2022-06-07 | Discharge: 2022-06-07 | Disposition: A | Discharge status: Home / Self Care | Payer: Managed Care, Other (non HMO) | Source: Ambulatory Visit | Attending: Obstetrics & Gynecology

## 2022-06-07 DIAGNOSIS — Z01818 Encounter for other preprocedural examination: Secondary | ICD-10-CM | POA: Diagnosis present

## 2022-06-07 DIAGNOSIS — N920 Excessive and frequent menstruation with regular cycle: Secondary | ICD-10-CM | POA: Diagnosis not present

## 2022-06-07 LAB — BASIC METABOLIC PANEL
Anion gap: 6 (ref 5–15)
BUN: 10 mg/dL (ref 6–20)
CO2: 27 mmol/L (ref 22–32)
Calcium: 9.2 mg/dL (ref 8.9–10.3)
Chloride: 107 mmol/L (ref 98–111)
Creatinine, Ser: 0.7 mg/dL (ref 0.44–1.00)
GFR, Estimated: >60 mL/min (ref >60)
Glucose, Bld: 91 mg/dL (ref 70–99)
Potassium: 4 mmol/L (ref 3.5–5.1)
Sodium: 140 mmol/L (ref 135–145)

## 2022-06-07 LAB — CBC
HCT: 40.4 % (ref 36.0–46.0)
Hemoglobin: 12.9 g/dL (ref 12.0–15.0)
MCH: 28.9 pg (ref 26.0–34.0)
MCHC: 31.9 g/dL (ref 30.0–36.0)
MCV: 90.6 fL (ref 80.0–100.0)
Platelets: 277 10*3/uL (ref 150–400)
RBC: 4.46 MIL/uL (ref 3.87–5.11)
RDW: 14 % (ref 11.5–15.5)
WBC: 9 10*3/uL (ref 4.0–10.5)
nRBC: 0 % (ref 0.0–0.2)

## 2022-06-12 ENCOUNTER — Encounter (HOSPITAL_BASED_OUTPATIENT_CLINIC_OR_DEPARTMENT_OTHER)
Admission: RE | Disposition: A | Discharge status: Home / Self Care | Payer: Self-pay | Source: Home / Self Care | Attending: Obstetrics & Gynecology

## 2022-06-12 ENCOUNTER — Encounter (HOSPITAL_BASED_OUTPATIENT_CLINIC_OR_DEPARTMENT_OTHER): Payer: Self-pay | Admitting: Obstetrics & Gynecology

## 2022-06-12 ENCOUNTER — Ambulatory Visit (HOSPITAL_BASED_OUTPATIENT_CLINIC_OR_DEPARTMENT_OTHER): Payer: Managed Care, Other (non HMO) | Admitting: Anesthesiology

## 2022-06-12 ENCOUNTER — Other Ambulatory Visit: Payer: Self-pay

## 2022-06-12 ENCOUNTER — Ambulatory Visit (HOSPITAL_BASED_OUTPATIENT_CLINIC_OR_DEPARTMENT_OTHER)
Admission: RE | Admit: 2022-06-12 | Discharge: 2022-06-13 | Disposition: A | Discharge status: Home / Self Care | Payer: Managed Care, Other (non HMO) | Attending: Obstetrics & Gynecology | Admitting: Obstetrics & Gynecology

## 2022-06-12 DIAGNOSIS — D251 Intramural leiomyoma of uterus: Secondary | ICD-10-CM

## 2022-06-12 DIAGNOSIS — J45909 Unspecified asthma, uncomplicated: Secondary | ICD-10-CM | POA: Insufficient documentation

## 2022-06-12 DIAGNOSIS — N920 Excessive and frequent menstruation with regular cycle: Secondary | ICD-10-CM | POA: Diagnosis present

## 2022-06-12 DIAGNOSIS — D259 Leiomyoma of uterus, unspecified: Secondary | ICD-10-CM | POA: Diagnosis not present

## 2022-06-12 DIAGNOSIS — Z01818 Encounter for other preprocedural examination: Secondary | ICD-10-CM

## 2022-06-12 DIAGNOSIS — Z1589 Genetic susceptibility to other disease: Secondary | ICD-10-CM | POA: Insufficient documentation

## 2022-06-12 HISTORY — DX: Excessive and frequent menstruation with regular cycle: N92.0

## 2022-06-12 HISTORY — DX: Cardiac murmur, unspecified: R01.1

## 2022-06-12 HISTORY — DX: Presence of spectacles and contact lenses: Z97.3

## 2022-06-12 HISTORY — PX: TOTAL LAPAROSCOPIC HYSTERECTOMY WITH SALPINGECTOMY: SHX6742

## 2022-06-12 HISTORY — PX: CYSTOSCOPY: SHX5120

## 2022-06-12 HISTORY — DX: Leiomyoma of uterus, unspecified: D25.9

## 2022-06-12 LAB — POCT PREGNANCY, URINE: Preg Test, Ur: NEGATIVE

## 2022-06-12 LAB — TYPE AND SCREEN
ABO/RH(D): O POS
Antibody Screen: NEGATIVE

## 2022-06-12 LAB — ABO/RH: ABO/RH(D): O POS

## 2022-06-12 SURGERY — HYSTERECTOMY, TOTAL, LAPAROSCOPIC, WITH SALPINGECTOMY
Anesthesia: General | Site: Bladder

## 2022-06-12 MED ORDER — ENOXAPARIN SODIUM 40 MG/0.4ML IJ SOSY
PREFILLED_SYRINGE | INTRAMUSCULAR | Status: AC
  Filled 2022-06-12: qty 0.4

## 2022-06-12 MED ORDER — KETOROLAC TROMETHAMINE 30 MG/ML IJ SOLN
INTRAMUSCULAR | Status: AC
  Filled 2022-06-12: qty 1

## 2022-06-12 MED ORDER — SODIUM CHLORIDE 0.9 % IR SOLN
Status: DC | PRN
  Administered 2022-06-12: 1000 mL via INTRAVESICAL
  Administered 2022-06-12: 1000 mL

## 2022-06-12 MED ORDER — FENTANYL CITRATE (PF) 100 MCG/2ML IJ SOLN
INTRAMUSCULAR | Status: DC | PRN
  Administered 2022-06-12 (×2): 50 ug via INTRAVENOUS
  Administered 2022-06-12: 100 ug via INTRAVENOUS
  Administered 2022-06-12: 50 ug via INTRAVENOUS

## 2022-06-12 MED ORDER — PANTOPRAZOLE SODIUM 40 MG IV SOLR
40.0000 mg | Freq: Every day | INTRAVENOUS | Status: DC
  Administered 2022-06-12: 40 mg via INTRAVENOUS

## 2022-06-12 MED ORDER — GABAPENTIN 100 MG PO CAPS: 100.0000 mg | ORAL_CAPSULE | Freq: Three times a day (TID) | ORAL | Status: DC

## 2022-06-12 MED ORDER — ALUM & MAG HYDROXIDE-SIMETH 200-200-20 MG/5ML PO SUSP: 30.0000 mL | ORAL | Status: DC | PRN

## 2022-06-12 MED ORDER — FENTANYL CITRATE (PF) 250 MCG/5ML IJ SOLN
INTRAMUSCULAR | Status: AC
  Filled 2022-06-12: qty 5

## 2022-06-12 MED ORDER — IBUPROFEN 200 MG PO TABS: 600.0000 mg | ORAL_TABLET | Freq: Four times a day (QID) | ORAL | Status: DC

## 2022-06-12 MED ORDER — SODIUM CHLORIDE (PF) 0.9 % IJ SOLN: INTRAMUSCULAR | Status: DC | PRN

## 2022-06-12 MED ORDER — ONDANSETRON HCL 4 MG/2ML IJ SOLN
INTRAMUSCULAR | Status: DC | PRN
  Administered 2022-06-12: 4 mg via INTRAVENOUS

## 2022-06-12 MED ORDER — MIDAZOLAM HCL 2 MG/2ML IJ SOLN
INTRAMUSCULAR | Status: AC
  Filled 2022-06-12: qty 2

## 2022-06-12 MED ORDER — DEXTROSE-NACL 5-0.45 % IV SOLN: INTRAVENOUS | Status: DC

## 2022-06-12 MED ORDER — DEXAMETHASONE SODIUM PHOSPHATE 10 MG/ML IJ SOLN
INTRAMUSCULAR | Status: AC
  Filled 2022-06-12: qty 1

## 2022-06-12 MED ORDER — ACETAMINOPHEN 500 MG PO TABS
1000.0000 mg | ORAL_TABLET | ORAL | Status: AC
  Administered 2022-06-12: 1000 mg via ORAL

## 2022-06-12 MED ORDER — KETOROLAC TROMETHAMINE 30 MG/ML IJ SOLN
30.0000 mg | Freq: Four times a day (QID) | INTRAMUSCULAR | Status: DC
  Administered 2022-06-12 – 2022-06-13 (×3): 30 mg via INTRAVENOUS

## 2022-06-12 MED ORDER — 0.9 % SODIUM CHLORIDE (POUR BTL) OPTIME
TOPICAL | Status: DC | PRN
  Administered 2022-06-12: 500 mL

## 2022-06-12 MED ORDER — PROPOFOL 10 MG/ML IV BOLUS
INTRAVENOUS | Status: DC | PRN
  Administered 2022-06-12: 150 mg via INTRAVENOUS

## 2022-06-12 MED ORDER — LACTATED RINGERS IV SOLN: INTRAVENOUS | Status: DC

## 2022-06-12 MED ORDER — SIMETHICONE 80 MG PO CHEW: 80.0000 mg | CHEWABLE_TABLET | Freq: Four times a day (QID) | ORAL | Status: DC | PRN

## 2022-06-12 MED ORDER — MEPERIDINE HCL 25 MG/ML IJ SOLN: 6.2500 mg | INTRAMUSCULAR | Status: DC | PRN

## 2022-06-12 MED ORDER — LIDOCAINE 2% (20 MG/ML) 5 ML SYRINGE
INTRAMUSCULAR | Status: DC | PRN
  Administered 2022-06-12: 100 mg via INTRAVENOUS

## 2022-06-12 MED ORDER — ACETAMINOPHEN 500 MG PO TABS
ORAL_TABLET | ORAL | Status: AC
  Filled 2022-06-12: qty 2

## 2022-06-12 MED ORDER — PROMETHAZINE HCL 25 MG RE SUPP
25.0000 mg | Freq: Four times a day (QID) | RECTAL | Status: DC | PRN
  Filled 2022-06-12: qty 1

## 2022-06-12 MED ORDER — AMISULPRIDE (ANTIEMETIC) 5 MG/2ML IV SOLN: 10.0000 mg | Freq: Once | INTRAVENOUS | Status: DC | PRN

## 2022-06-12 MED ORDER — MIDAZOLAM HCL 5 MG/5ML IJ SOLN
INTRAMUSCULAR | Status: DC | PRN
  Administered 2022-06-12: 2 mg via INTRAVENOUS

## 2022-06-12 MED ORDER — HEMOSTATIC AGENTS (NO CHARGE) OPTIME
TOPICAL | Status: DC | PRN
  Administered 2022-06-12: 1 via TOPICAL

## 2022-06-12 MED ORDER — HYDROMORPHONE HCL 1 MG/ML IJ SOLN
0.2000 mg | INTRAMUSCULAR | Status: DC | PRN
  Administered 2022-06-12 (×2): 0.6 mg via INTRAVENOUS

## 2022-06-12 MED ORDER — PROMETHAZINE HCL 25 MG PO TABS: 25.0000 mg | ORAL_TABLET | Freq: Four times a day (QID) | ORAL | Status: DC | PRN

## 2022-06-12 MED ORDER — BUPIVACAINE HCL (PF) 0.25 % IJ SOLN
INTRAMUSCULAR | Status: DC | PRN
  Administered 2022-06-12: 17 mL

## 2022-06-12 MED ORDER — OXYCODONE-ACETAMINOPHEN 5-325 MG PO TABS
1.0000 | ORAL_TABLET | ORAL | Status: DC | PRN
  Administered 2022-06-13 (×2): 2 via ORAL

## 2022-06-12 MED ORDER — ONDANSETRON HCL 4 MG/2ML IJ SOLN
INTRAMUSCULAR | Status: AC
  Filled 2022-06-12: qty 2

## 2022-06-12 MED ORDER — HYDROMORPHONE HCL 1 MG/ML IJ SOLN: 0.2500 mg | INTRAMUSCULAR | Status: DC | PRN

## 2022-06-12 MED ORDER — MENTHOL 3 MG MT LOZG: 1.0000 | LOZENGE | OROMUCOSAL | Status: DC | PRN

## 2022-06-12 MED ORDER — ROCURONIUM BROMIDE 10 MG/ML (PF) SYRINGE
PREFILLED_SYRINGE | INTRAVENOUS | Status: DC | PRN
  Administered 2022-06-12: 10 mg via INTRAVENOUS
  Administered 2022-06-12: 80 mg via INTRAVENOUS

## 2022-06-12 MED ORDER — FLUORESCEIN SODIUM 10 % IV SOLN
INTRAVENOUS | Status: AC
  Filled 2022-06-12: qty 5

## 2022-06-12 MED ORDER — DEXAMETHASONE SODIUM PHOSPHATE 10 MG/ML IJ SOLN
INTRAMUSCULAR | Status: DC | PRN
  Administered 2022-06-12: 10 mg via INTRAVENOUS

## 2022-06-12 MED ORDER — KETOROLAC TROMETHAMINE 30 MG/ML IJ SOLN
INTRAMUSCULAR | Status: DC | PRN
  Administered 2022-06-12: 30 mg via INTRAVENOUS

## 2022-06-12 MED ORDER — OXYCODONE HCL 5 MG PO TABS: 5.0000 mg | ORAL_TABLET | Freq: Once | ORAL | Status: DC | PRN

## 2022-06-12 MED ORDER — ONDANSETRON HCL 4 MG/2ML IJ SOLN: 4.0000 mg | Freq: Four times a day (QID) | INTRAMUSCULAR | Status: DC | PRN

## 2022-06-12 MED ORDER — SODIUM CHLORIDE 0.9 % IV SOLN
12.5000 mg | Freq: Four times a day (QID) | INTRAVENOUS | Status: DC | PRN
  Administered 2022-06-12 – 2022-06-13 (×3): 12.5 mg via INTRAVENOUS
  Filled 2022-06-12: qty 0.5
  Filled 2022-06-12 (×2): qty 12.5
  Filled 2022-06-12: qty 0.5

## 2022-06-12 MED ORDER — PROMETHAZINE HCL 25 MG/ML IJ SOLN: 6.2500 mg | INTRAMUSCULAR | Status: DC | PRN

## 2022-06-12 MED ORDER — ENOXAPARIN SODIUM 40 MG/0.4ML IJ SOSY
40.0000 mg | PREFILLED_SYRINGE | INTRAMUSCULAR | Status: AC
  Administered 2022-06-12: 40 mg via SUBCUTANEOUS

## 2022-06-12 MED ORDER — HYDROMORPHONE HCL 1 MG/ML IJ SOLN
INTRAMUSCULAR | Status: AC
  Filled 2022-06-12: qty 1

## 2022-06-12 MED ORDER — METRONIDAZOLE 500 MG/100ML IV SOLN
INTRAVENOUS | Status: AC
  Filled 2022-06-12: qty 100

## 2022-06-12 MED ORDER — PANTOPRAZOLE SODIUM 40 MG IV SOLR
INTRAVENOUS | Status: AC
  Filled 2022-06-12: qty 10

## 2022-06-12 MED ORDER — POVIDONE-IODINE 10 % EX SWAB: 2.0000 | Freq: Once | CUTANEOUS | Status: DC

## 2022-06-12 MED ORDER — METRONIDAZOLE 500 MG/100ML IV SOLN
500.0000 mg | INTRAVENOUS | Status: AC
  Administered 2022-06-12: 500 mg via INTRAVENOUS

## 2022-06-12 MED ORDER — OXYCODONE HCL 5 MG/5ML PO SOLN: 5.0000 mg | Freq: Once | ORAL | Status: DC | PRN

## 2022-06-12 MED ORDER — GENTAMICIN SULFATE 40 MG/ML IJ SOLN
360.0000 mg | INTRAVENOUS | Status: AC
  Administered 2022-06-12: 360 mg via INTRAVENOUS
  Filled 2022-06-12: qty 9

## 2022-06-12 MED ORDER — ONDANSETRON HCL 4 MG PO TABS: 4.0000 mg | ORAL_TABLET | Freq: Four times a day (QID) | ORAL | Status: DC | PRN

## 2022-06-12 MED ORDER — FLUORESCEIN SODIUM 10 % IV SOLN
INTRAVENOUS | Status: DC | PRN
  Administered 2022-06-12: 1 mL via INTRAVENOUS

## 2022-06-12 MED ORDER — SUGAMMADEX SODIUM 200 MG/2ML IV SOLN
INTRAVENOUS | Status: DC | PRN
  Administered 2022-06-12: 140 mg via INTRAVENOUS

## 2022-06-12 MED ORDER — ROCURONIUM BROMIDE 10 MG/ML (PF) SYRINGE
PREFILLED_SYRINGE | INTRAVENOUS | Status: AC
  Filled 2022-06-12: qty 10

## 2022-06-12 MED ORDER — LIDOCAINE HCL (PF) 2 % IJ SOLN
INTRAMUSCULAR | Status: AC
  Filled 2022-06-12: qty 5

## 2022-06-12 SURGICAL SUPPLY — 66 items
ADH SKN CLS APL DERMABOND .7 (GAUZE/BANDAGES/DRESSINGS) ×4
APL PRP STRL LF DISP 70% ISPRP (MISCELLANEOUS) ×2
APL SRG 38 LTWT LNG FL B (MISCELLANEOUS) ×2
APPLICATOR ARISTA FLEXITIP XL (MISCELLANEOUS) IMPLANT
BLADE SURG 10 STRL SS (BLADE) IMPLANT
CABLE HIGH FREQUENCY MONO STRZ (ELECTRODE) IMPLANT
CELL SAVER LIPIGURD (MISCELLANEOUS) IMPLANT
CHLORAPREP W/TINT 26 (MISCELLANEOUS) ×2 IMPLANT
COVER BACK TABLE 60X90IN (DRAPES) ×3 IMPLANT
COVER MAYO STAND STRL (DRAPES) ×2 IMPLANT
COVER SURGICAL LIGHT HANDLE (MISCELLANEOUS) IMPLANT
DERMABOND ADVANCED .7 DNX12 (GAUZE/BANDAGES/DRESSINGS) ×2 IMPLANT
DEVICE RETRIEVAL ALEXIS 14 (MISCELLANEOUS) IMPLANT
DILATOR CANAL MILEX (MISCELLANEOUS) IMPLANT
DRSG COVADERM PLUS 2X2 (GAUZE/BANDAGES/DRESSINGS) IMPLANT
EXTRT SYSTEM ALEXIS 14CM (MISCELLANEOUS)
EXTRT SYSTEM ALEXIS 17CM (MISCELLANEOUS)
GAUZE 4X4 16PLY ~~LOC~~+RFID DBL (SPONGE) ×4 IMPLANT
GLOVE BIO SURGEON STRL SZ 6.5 (GLOVE) ×3 IMPLANT
GLOVE BIOGEL PI IND STRL 6.5 (GLOVE) ×2 IMPLANT
GLOVE BIOGEL PI IND STRL 7.0 (GLOVE) ×6 IMPLANT
GLOVE ECLIPSE 6.5 STRL STRAW (GLOVE) ×4 IMPLANT
GOWN STRL REUS W/TWL XL LVL3 (GOWN DISPOSABLE) ×6 IMPLANT
HEMOSTAT ARISTA ABSORB 3G PWDR (HEMOSTASIS) IMPLANT
KIT TURNOVER CYSTO (KITS) ×3 IMPLANT
LEGGING LITHOTOMY PAIR STRL (DRAPES) ×2 IMPLANT
LIGASURE VESSEL 5MM BLUNT TIP (ELECTROSURGICAL) ×3 IMPLANT
NDL INSUFFLATION 14GA 120MM (NEEDLE) ×2 IMPLANT
NEEDLE INSUFFLATION 14GA 120MM (NEEDLE) ×2 IMPLANT
NS IRRIG 1000ML POUR BTL (IV SOLUTION) ×3 IMPLANT
OCCLUDER COLPOPNEUMO (BALLOONS) ×3 IMPLANT
PACK LAPAROSCOPY BASIN (CUSTOM PROCEDURE TRAY) ×2 IMPLANT
PACK TRENDGUARD 450 HYBRID PRO (MISCELLANEOUS) ×2 IMPLANT
PENCIL SMOKE EVACUATOR (MISCELLANEOUS) IMPLANT
POUCH LAPAROSCOPIC INSTRUMENT (MISCELLANEOUS) ×3 IMPLANT
PROTECTOR NERVE ULNAR (MISCELLANEOUS) ×6 IMPLANT
SCISSORS LAP 5X35 DISP (ENDOMECHANICALS) IMPLANT
SET IRRIG Y TYPE TUR BLADDER L (SET/KITS/TRAYS/PACK) ×2 IMPLANT
SET SUCTION IRRIG HYDROSURG (IRRIGATION / IRRIGATOR) ×2 IMPLANT
SET TRI-LUMEN FLTR TB AIRSEAL (TUBING) ×3 IMPLANT
SHEARS HARMONIC ACE PLUS 36CM (ENDOMECHANICALS) ×3 IMPLANT
SUT VIC AB 0 CT1 27 (SUTURE) ×4
SUT VIC AB 0 CT1 27XBRD ANBCTR (SUTURE) ×6 IMPLANT
SUT VIC AB 4-0 PS2 18 (SUTURE) ×2 IMPLANT
SUT VICRYL 0 UR6 27IN ABS (SUTURE) IMPLANT
SUT VLOC 180 0 9IN  GS21 (SUTURE) ×2
SUT VLOC 180 0 9IN GS21 (SUTURE) ×2 IMPLANT
SYR 10ML LL (SYRINGE) ×2 IMPLANT
SYR 50ML LL SCALE MARK (SYRINGE) ×6 IMPLANT
SYR CONTROL 10ML LL (SYRINGE) IMPLANT
SYSTEM CARTER THOMASON II (TROCAR) IMPLANT
SYSTEM CONTND EXTRCTN KII BLLN (MISCELLANEOUS) IMPLANT
TIP UTERINE 5.1X6CM LAV DISP (MISCELLANEOUS) IMPLANT
TIP UTERINE 6.7X10CM GRN DISP (MISCELLANEOUS) IMPLANT
TIP UTERINE 6.7X6CM WHT DISP (MISCELLANEOUS) IMPLANT
TIP UTERINE 6.7X8CM BLUE DISP (MISCELLANEOUS) IMPLANT
TOWEL OR 17X26 10 PK STRL BLUE (TOWEL DISPOSABLE) ×4 IMPLANT
TRAP SPECIMEN MUCUS 40CC (MISCELLANEOUS) IMPLANT
TRAY FOLEY W/BAG SLVR 14FR LF (SET/KITS/TRAYS/PACK) ×2 IMPLANT
TRENDGUARD 450 HYBRID PRO PACK (MISCELLANEOUS) ×2
TROCAR ADV FIXATION 5X100MM (TROCAR) ×3 IMPLANT
TROCAR PORT AIRSEAL 5X120 (TROCAR) ×3 IMPLANT
TROCAR XCEL NON BLADE 8MM B8LT (ENDOMECHANICALS) ×3 IMPLANT
TROCAR Z-THREAD FIOS 5X100MM (TROCAR) ×2 IMPLANT
WARMER LAPAROSCOPE (MISCELLANEOUS) ×3 IMPLANT
WATER STERILE IRR 3000ML UROMA (IV SOLUTION) ×3 IMPLANT

## 2022-06-12 NOTE — Progress Notes (Signed)
Day of Surgery Procedure(s) (LRB): TOTAL LAPAROSCOPIC HYSTERECTOMY WITH BILATERAL SALPINGO-OOPHORECTOMY (Bilateral) CYSTOSCOPY (N/A)  Subjective: Patient reports she is still having nausea.  Pain is adequately controlled.  Has not thrown up.  Has had a little bit of liquid.    Objective: I have reviewed patient's vital signs, intake and output, medications, and labs. Vitals:   06/12/22 1630 06/12/22 1745  BP: 109/65 107/62  Pulse: 84 79  Resp: 16 14  Temp:    SpO2: 97% 99%   UOP: 650cc  General: alert and no distress Resp: clear to auscultation bilaterally Cardio: regular rate and rhythm, S1, S2 normal, no murmur, click, rub or gallop GI: soft, mildly distended, quiet Extremities: extremities normal, atraumatic, no cyanosis or edema Vaginal Bleeding: minimal  Assessment: s/p Procedure(s): TOTAL LAPAROSCOPIC HYSTERECTOMY WITH BILATERAL SALPINGO-OOPHORECTOMY (Bilateral) CYSTOSCOPY (N/A):  post op nausea, adequate pain control  Plan: Continue IV , continue anti-emetics Encourage ambulation Plan CBC and CMP in the am  LOS: 0 days    Megan Salon, MD 06/12/2022, 8:25 PM

## 2022-06-12 NOTE — Anesthesia Procedure Notes (Signed)
Procedure Name: Intubation Date/Time: 06/12/2022 12:42 PM  Performed by: Bonney Aid, CRNAPre-anesthesia Checklist: Patient identified, Emergency Drugs available, Suction available and Patient being monitored Patient Re-evaluated:Patient Re-evaluated prior to induction Oxygen Delivery Method: Circle system utilized Preoxygenation: Pre-oxygenation with 100% oxygen Induction Type: IV induction Ventilation: Mask ventilation without difficulty Laryngoscope Size: Mac and 3 Grade View: Grade I Tube type: Oral Tube size: 7.0 mm Number of attempts: 1 Airway Equipment and Method: Stylet Placement Confirmation: ETT inserted through vocal cords under direct vision, positive ETCO2 and breath sounds checked- equal and bilateral Secured at: 20 cm Tube secured with: Tape Dental Injury: Teeth and Oropharynx as per pre-operative assessment

## 2022-06-12 NOTE — Transfer of Care (Signed)
Immediate Anesthesia Transfer of Care Note  Patient: Christina Faulkner  Procedure(s) Performed: TOTAL LAPAROSCOPIC HYSTERECTOMY WITH BILATERAL SALPINGO-OOPHORECTOMY (Bilateral: Abdomen) CYSTOSCOPY (Bladder)  Patient Location: PACU  Anesthesia Type:General  Level of Consciousness: awake, alert  and oriented  Airway & Oxygen Therapy: Patient Spontanous Breathing and Patient connected to nasal cannula oxygen  Post-op Assessment: Report given to RN  Post vital signs: Reviewed and stable  Last Vitals:  Vitals Value Taken Time  BP 115/68 06/12/22 1509  Temp    Pulse 94 06/12/22 1509  Resp 11 06/12/22 1509  SpO2 100 % 06/12/22 1509  Vitals shown include unvalidated device data.  Last Pain:  Vitals:   06/12/22 0944  TempSrc: Oral  PainSc: 0-No pain      Patients Stated Pain Goal: 6 (60/16/58 0063)  Complications: No notable events documented.

## 2022-06-12 NOTE — H&P (Signed)
Christina Faulkner is an 46 y.o. female G45P2 MWF with strong family hx of colon cancer concerning for Lynch syndrome.  She also has hx of menorrhagia and uterine fibroids.  Uterus measures 10 x 6 x 5 with several small fibroids present.  She has decided that she wants to proceed with definitive treatment for her menorrhagia and also have ovaries removed as there is concern for Lynch syndrome.  We have discussed having genetic testing done over the past several years and has decided to have this done.  Genetic testing is planned but will not be done until November.  She has decided that this does not matter and due to menorrhagia and future cancer risks, she desires definitive surgical treatment.   Procedure, risks and benefits have been discussed.  She is here and ready to proceed.  She is aware the only alternative is monitoring with endometrial biopsies, ultrasounds and tumor marker testing.    Pertinent Gynecological History: Menses:  menorrhagia with regular cycles Contraception: tubal ligation DES exposure: denies Blood transfusions: none Sexually transmitted diseases: no past history Previous GYN Procedures:  NSVD x 2   Last mammogram: normal Date: 01/07/2022 Last pap: normal Date: 01/02/2021 OB History: G2, P2      Past Medical History:  Diagnosis Date   Asthma    last asthma exacerbation about 6 months ago per pt as of 05/23/22, treated w/ prednisone. Patient uses advair inhaler twice daily and albuterol inhaler  as needed, pt states that she has been using it  about once a week in summer per pt. Follows with Vicenta Aly, NP.   Cholelithiasis 2011   Diverticulosis 08/16/2009   hx of mild diverticulitis   DOE (dyspnea on exertion) 01/11/2021   See OV note from Dr. Marina Goodell, cardiology, [place] likely due to recent asthma exacerbation and heart mumur. 01/26/21 Echo in [place], EF 60%.   Heart murmur    01/26/21 echocardiogram in [facility], EF 60%   Menorrhagia    with  regular cycle   Prediabetes 01/02/2021   HgA1c 5.9 on 01/02/21, Pt is currently taking Wegovy as of 05/22/22. Last HgbA1c on 05/01/22 was 5.1.   Uterine leiomyoma    Wears contact lenses    Wears glasses     Past Surgical History:  Procedure Laterality Date   BIOPSY ENDOMETRIAL  2022   COLONOSCOPY  01/14/2009   diverticulosis, Dr. Collene Mares   COLONOSCOPY  02/14/2021   diverticulosis, Dr. Collene Mares   LAPAROSCOPIC CHOLECYSTECTOMY  09/16/2009   hernia repair   TUBAL LIGATION  04/16/2002    Family History  Problem Relation Age of Onset   Cancer Father        removed kidney-aggressive tumor in ureter   Colon cancer Father 24       63 recurrent colon cancer   Colon cancer Paternal Grandfather    Diabetes Maternal Aunt    Diabetes Maternal Grandmother    Diabetes Maternal Grandfather    Hypertension Maternal Grandfather    Heart disease Maternal Grandfather    Migraines Sister     Social History:  reports that she has never smoked. She has never used smokeless tobacco. She reports that she does not drink alcohol and does not use drugs.  Allergies:  Allergies  Allergen Reactions   Amoxicillin Rash   Keflex [Cephalexin] Rash   Penicillins Swelling and Rash   Codeine Rash   Sulfa Antibiotics Rash    Medications Prior to Admission  Medication Sig Dispense Refill Last  Dose   ADVAIR DISKUS 250-50 MCG/ACT AEPB Inhale 1 puff into the lungs 2 (two) times daily.   06/11/2022   albuterol (PROAIR HFA) 108 (90 Base) MCG/ACT inhaler every 4 (four) hours as needed.   06/12/2022   ALPRAZolam (XANAX) 0.5 MG tablet Take 1 tablet (0.5 mg total) by mouth as needed for sleep. 30 tablet 1 06/11/2022   ibuprofen (ADVIL) 200 MG tablet Take 200 mg by mouth every 6 (six) hours as needed for fever, headache or mild pain.   Past Week   montelukast (SINGULAIR) 10 MG tablet Take 1 tablet by mouth at bedtime.   06/11/2022   ondansetron (ZOFRAN ODT) 4 MG disintegrating tablet Take 1 tablet (4 mg total) by mouth every 8  (eight) hours as needed for nausea or vomiting. 20 tablet 0 Past Week   WEGOVY 2.4 MG/0.75ML SOAJ Inject into the skin. Takes on Monday.   Past Month    Review of Systems  All other systems reviewed and are negative.   Blood pressure 125/81, pulse 77, temperature 97.7 F (36.5 C), temperature source Oral, resp. rate 15, height '5\' 2"'$  (1.575 m), weight 70.7 kg, SpO2 100 %. Physical Exam Constitutional:      Appearance: Normal appearance.  Cardiovascular:     Rate and Rhythm: Normal rate and regular rhythm.  Pulmonary:     Effort: Pulmonary effort is normal.     Breath sounds: Normal breath sounds.  Neurological:     General: No focal deficit present.     Mental Status: She is alert.  Psychiatric:        Mood and Affect: Mood normal.     Results for orders placed or performed during the hospital encounter of 06/12/22 (from the past 24 hour(s))  Pregnancy, urine POC     Status: None   Collection Time: 06/12/22  9:27 AM  Result Value Ref Range   Preg Test, Ur NEGATIVE NEGATIVE  ABO/Rh     Status: None   Collection Time: 06/12/22 10:10 AM  Result Value Ref Range   ABO/RH(D)      O POS Performed at [facility], [place] [address], [place], [place] [address]     No results found.  Assessment/Plan: 46 yo G2P2 MWF with h/o menorrhagia, uterine fibroids, family hx of cancer concerning for Lynch syndrome here for definitive surgery with TLH/BSO, cystoscopy.  [place]ings will be obtained as well.  Questions answered.  Pt ready to proceed.  Megan Salon 06/12/2022, 12:21 PM

## 2022-06-12 NOTE — Op Note (Signed)
06/12/2022  3:20 PM  PATIENT:  Christina Faulkner  46 y.o. female  PRE-OPERATIVE DIAGNOSIS:  MENORRHAGIA, FIBROIDS, FAMILY HISTORY OF LYNCH SYNDROME  POST-OPERATIVE DIAGNOSIS:  MENORRHAGIA, FIBROIDS, FAMILY HISTORY OF LYNCH SYNDROME  PROCEDURE:  Procedure(s): TOTAL LAPAROSCOPIC HYSTERECTOMY WITH BILATERAL SALPINGO-OOPHORECTOMY CYSTOSCOPY, CYTOLOGIC WASHINGS  SURGEON:  Megan Salon  ASSISTANTS: Dr. Harolyn Rutherford.  An experienced assistant was required given the standard of surgical care given the complexity of the case.  This assistant was needed for exposure, dissection, suctioning, retraction, instrument exchange and for overall help during the procedure.  RNFA help was also unavailable.  ANESTHESIA:   general  ESTIMATED BLOOD LOSS: 25 mL  BLOOD ADMINISTERED:none   FLUIDS: 1000 cc LR  UOP: 500cc clear UOPD  SPECIMEN:  uterus, cervix, bilateral fallopian tubes, cytologic washings.  DISPOSITION OF SPECIMEN:  PATHOLOGY  FINDINGS: bulky, uterus, h/o BTL with portion of tubes missing, normal upper abdomen.  DESCRIPTION OF OPERATION: Patient is taken to the operating room. She is placed in the supine position. She is a running IV in place. Informed consent was present on the chart. SCDs on her lower extremities and functioning properly. Patient was positioned while she was awake.  Her legs were placed in the low lithotomy position in [place]. Her arms were tucked by the side.  General endotracheal anesthesia was administered by the anesthesia staff without difficulty. Dr. Sabra Heck, anesthesia, oversaw case.  Time out performed.    Clora prep was then used to prep the abdomen and Hibiclens was used to prep the inner thighs, perineum and vagina. Once 3 minutes had past the patient was draped in a normal standard fashion. The legs were lifted to the high lithotomy position. The cervix was visualized by placing a heavy weighted speculum in the posterior aspect of the vagina and using a curved  Deaver retractor to the retract anteriorly. The anterior lip of the cervix was grasped with single-tooth tenaculum.  The cervix sounded to 9 cm. Pratt dilators were used to dilate the cervix up to a #21. A RUMI uterine manipulator was obtained. A #8 disposable tip was placed on the RUMI manipulator as well as a 3.0, silver KOH ring. This was passed through the cervix and the bulb of the disposable tip was inflated with 10 cc of normal saline. There was a good fit of the KOH ring around the cervix. The tenaculum was removed. There is also good manipulation of the uterus. The speculum and retractor were removed as well. A Foley catheter was placed to straight drain.  Clear urine was noted. Legs were lowered to the low lithotomy position and attention was turned the abdomen.  The umbilicus was everted.  Marcaine 0.25% used to anesthetize the skin.  Using #11 blade, 66m skin incision was made.  A Veress needle was obtained. Syringe of sterile saline was placed on a open Veress needle.  With the abdomen elevated, the Veress needle was passed into the umbilicus until the pop was heard and then fluid started to drip.  Then low flow CO2 gas was attached the needle and the pneumoperitoneum was achieved without difficulty. Once four liters of gas was in the abdomen the Veress needle was removed and a 5 millimeter non-bladed Optiview trocar and port were passed directly to the abdomen. The laparoscope was then used to confirm intraperitoneal placement. Findings included bulky uterus.  Locations for RLQ, LLQ, and suprapubic ports were noted by transillumination of the abdominal wall.  0.25% marcaine was used to anesthetize the  skin.  61m skin incision was made in the RLQ and an AirSeal port was placed underdirect visualization of the laparoscope.  Then a 539mskin incision was made and a 61m24monbladed trochar and port was placed in the LLQ.  Finally, and 8mm71min incision was made about 4cm above the pubic symphasis and an 8mm 76mn-bladed port was placed with direct visualization of the laparoscope.  All trochars were removed.    Ureters were identifies.  Attention was turned to the left side. With uterus on stretch the left tube was excised off the ovary and mesosalpinx was dissected to free the tube. Then the left utero-ovarian pedicle was serially clamped cauterized and incised using the ligasure device. Left round ligament was serially clamped cauterized and incised. The anterior and posterior peritoneum of the inferior leaf of the broad ligament were opened. The beginning of the bladder flap was created.  The bladder was taken down below the level of the KOH ring. The left uterine artery skeletonized and then just superior to the KOH ring this vessel was serially clamped, cauterized, and incised.  Attention was turned the right side.  The uterus was placed on stretch to the opposite side.  The tube was excised off the ovary using sharp dissection a bipolar cautery.  The mesosalpinx was incised freeing the tube. Then the right uterine ovarian pedicle was serially clamped cauterized and incised. Next the right round ligament was serially clamped cauterized and incised. The anterior posterior peritoneum of the inferiorly for the broad ligament were opened. The anterior peritoneum was carried across to the dissection on the left side. The remainder of the bladder flap was created using sharp dissection. The bladder was well below the level of the KOH ring. The left uterine artery skeletonized. Then the left uterine artery, above the level of the KOH ring, was serially clamped cauterized and incised. The uterus was devascularized at this point.  The colpotomy was performed a starting in the midline and using a harmonic scalpel with the inferior edge of the open blade  This was carried around a circumferential fashion until the vaginal mucosa was completely incised in the specimen was freed.  The specimen was then delivered to the  vagina.  A vaginal occlusive device was used to maintain the pneumoperitoneum  Instruments were changed with a needle driver and Kobra graspers.  Using a 9 inch V. lock suture, the cuff was closed by incorporating the anterior and posterior vaginal mucosa in each stitch. This was carried across all the way to the left corner and a running fashion. Two stitches were brought back towards the midline and the suture was cut flush with the vagina. The needle was brought out the pelvis. The pelvis was irrigated. All pedicles were inspected. No bleeding was noted.   Co2 pressures were lowered to 8mm H561m Again, no bleeding was noted.  Ureters were noted deep in the pelvis to be peristalsing.  Arista was placed along the pedicles.  At this point the procedure was completed.  The remaining instruments were removed.  The ports (except the suprapubic port) were removed under direct visualization of the laparoscope and the pneumoperitoneum was relieved.  The patient was taken out of Trendelenburg positioning.  Several deep breaths were given to the patient's trying to any gas the abdomen and finally the suprapubic port was removed.  The skin was then closed with subcuticular stitches of 3-0 Vicryl. The skin was cleansed Dermabond was applied. Attention was then turned  the vagina and the cuff was inspected. No bleeding was noted. The anterior posterior vaginal mucosa was incorporated in each stitch. The Foley catheter was removed.  Cystoscopy was performed.  No sutures or bladder injuries were noted.  Ureters were noted with normal urine jets from each one was seen.  Foley was left out after the cystoscopic fluid was drained and cystoscope removed.  Sponge, lap, needle, instrument counts were correct x2. Patient tolerated the procedure very well. She was awakened from anesthesia, extubated and taken to recovery in stable condition.    COUNTS:  YES  PLAN OF CARE: Transfer to PACU

## 2022-06-12 NOTE — Anesthesia Preprocedure Evaluation (Signed)
Anesthesia Evaluation  Patient identified by MRN, date of birth, ID band Patient awake    Reviewed: Allergy & Precautions, NPO status , Patient's Chart, lab work & pertinent test results  Airway Mallampati: II  TM Distance: >3 FB Neck ROM: Full    Dental no notable dental hx.    Pulmonary asthma ,    Pulmonary exam normal breath sounds clear to auscultation       Cardiovascular negative cardio ROS Normal cardiovascular exam Rhythm:Regular Rate:Normal     Neuro/Psych negative neurological ROS  negative psych ROS   GI/Hepatic negative GI ROS, Neg liver ROS,   Endo/Other  negative endocrine ROS  Renal/GU negative Renal ROS  negative genitourinary   Musculoskeletal negative musculoskeletal ROS (+)   Abdominal   Peds negative pediatric ROS (+)  Hematology negative hematology ROS (+)   Anesthesia Other Findings   Reproductive/Obstetrics negative OB ROS                             Anesthesia Physical Anesthesia Plan  ASA: 2  Anesthesia Plan: General   Post-op Pain Management: Tylenol PO (pre-op)*, Dilaudid IV and Toradol IV (intra-op)*   Induction: Intravenous  PONV Risk Score and Plan: 3 and Ondansetron, Dexamethasone, Midazolam and Treatment may vary due to age or medical condition  Airway Management Planned: Oral ETT  Additional Equipment:   Intra-op Plan:   Post-operative Plan: Extubation in OR  Informed Consent: I have reviewed the patients History and Physical, chart, labs and discussed the procedure including the risks, benefits and alternatives for the proposed anesthesia with the patient or authorized representative who has indicated his/her understanding and acceptance.     Dental advisory given  Plan Discussed with: CRNA  Anesthesia Plan Comments:         Anesthesia Quick Evaluation

## 2022-06-13 ENCOUNTER — Other Ambulatory Visit (HOSPITAL_COMMUNITY): Payer: Self-pay

## 2022-06-13 ENCOUNTER — Encounter (HOSPITAL_BASED_OUTPATIENT_CLINIC_OR_DEPARTMENT_OTHER): Payer: Self-pay | Admitting: Obstetrics & Gynecology

## 2022-06-13 ENCOUNTER — Other Ambulatory Visit (HOSPITAL_BASED_OUTPATIENT_CLINIC_OR_DEPARTMENT_OTHER): Payer: Self-pay | Admitting: Obstetrics & Gynecology

## 2022-06-13 DIAGNOSIS — D259 Leiomyoma of uterus, unspecified: Secondary | ICD-10-CM | POA: Diagnosis not present

## 2022-06-13 LAB — COMPREHENSIVE METABOLIC PANEL
ALT: 10 U/L (ref 0–44)
AST: 14 U/L — ABNORMAL LOW (ref 15–41)
Albumin: 3.1 g/dL — ABNORMAL LOW (ref 3.5–5.0)
Alkaline Phosphatase: 38 U/L (ref 38–126)
Anion gap: 5 (ref 5–15)
BUN: 10 mg/dL (ref 6–20)
CO2: 23 mmol/L (ref 22–32)
Calcium: 8.3 mg/dL — ABNORMAL LOW (ref 8.9–10.3)
Chloride: 110 mmol/L (ref 98–111)
Creatinine, Ser: 0.66 mg/dL (ref 0.44–1.00)
GFR, Estimated: >60 mL/min (ref >60)
Glucose, Bld: 142 mg/dL — ABNORMAL HIGH (ref 70–99)
Potassium: 3.9 mmol/L (ref 3.5–5.1)
Sodium: 138 mmol/L (ref 135–145)
Total Bilirubin: 0.7 mg/dL (ref 0.3–1.2)
Total Protein: 5.7 g/dL — ABNORMAL LOW (ref 6.5–8.1)

## 2022-06-13 LAB — HEMOGLOBIN: Hemoglobin: 10.8 g/dL — ABNORMAL LOW (ref 12.0–15.0)

## 2022-06-13 MED ORDER — OXYCODONE-ACETAMINOPHEN 5-325 MG PO TABS
ORAL_TABLET | ORAL | Status: AC
  Filled 2022-06-13: qty 2

## 2022-06-13 MED ORDER — PROMETHAZINE HCL 25 MG PO TABS
25.0000 mg | ORAL_TABLET | Freq: Four times a day (QID) | ORAL | 0 refills | Status: DC | PRN
  Filled 2022-06-13: qty 30, 8d supply, fill #0

## 2022-06-13 MED ORDER — OXYCODONE-ACETAMINOPHEN 5-325 MG PO TABS
1.0000 | ORAL_TABLET | Freq: Four times a day (QID) | ORAL | 0 refills | Status: AC | PRN
  Filled 2022-06-13: qty 30, 4d supply, fill #0

## 2022-06-13 MED ORDER — IBUPROFEN 800 MG PO TABS
800.0000 mg | ORAL_TABLET | Freq: Three times a day (TID) | ORAL | 0 refills | Status: DC | PRN
  Filled 2022-06-13: qty 30, 10d supply, fill #0

## 2022-06-13 MED ORDER — KETOROLAC TROMETHAMINE 30 MG/ML IJ SOLN
INTRAMUSCULAR | Status: AC
  Filled 2022-06-13: qty 1

## 2022-06-13 NOTE — Progress Notes (Signed)
1 Day Post-Op Procedure(s) (LRB): TOTAL LAPAROSCOPIC HYSTERECTOMY WITH BILATERAL SALPINGO-OOPHORECTOMY (Bilateral) CYSTOSCOPY (N/A)  Subjective: Patient reports nausea is improved but just ate something and feeling some nausea again.  Has not had emesis.  Voiding and walking.  Took percocet without issues.  Good pain control.  Objective: I have reviewed patient's vital signs, intake and output, medications, and labs. Vitals:   06/13/22 0235 06/13/22 0605  BP: (!) 91/55 (!) 92/50  Pulse: 62 66  Resp: 12 12  Temp: 97.9 F (36.6 C) 98.4 F (36.9 C)  SpO2: 96% 96%   General: alert and no distress Resp: clear to auscultation bilaterally Cardio: regular rate and rhythm, S1, S2 normal, no murmur, click, rub or gallop GI: soft, non-tender; bowel sounds normal; no masses,  no organomegaly and incision: clean, dry, and intact Extremities: extremities normal, atraumatic, no cyanosis or edema Vaginal Bleeding: none  Assessment: s/p Procedure(s): TOTAL LAPAROSCOPIC HYSTERECTOMY WITH BILATERAL SALPINGO-OOPHORECTOMY (Bilateral) CYSTOSCOPY (N/A): stable, progressing well, and with some milder post op nausea  Plan: Advance diet Encourage ambulation Advance to PO medication Discontinue IV fluids Hopeful discharge later  LOS: 0 days    Megan Salon, MD 06/13/2022, 7:55 AM

## 2022-06-14 LAB — SURGICAL PATHOLOGY

## 2022-06-14 NOTE — Anesthesia Postprocedure Evaluation (Signed)
Anesthesia Post Note  Patient: Christina Faulkner  Procedure(s) Performed: TOTAL LAPAROSCOPIC HYSTERECTOMY WITH BILATERAL SALPINGO-OOPHORECTOMY (Bilateral: Abdomen) CYSTOSCOPY (Bladder)     Patient location during evaluation: PACU Anesthesia Type: General Level of consciousness: awake and alert Pain management: pain level controlled Vital Signs Assessment: post-procedure vital signs reviewed and stable Respiratory status: spontaneous breathing, nonlabored ventilation and respiratory function stable Cardiovascular status: blood pressure returned to baseline and stable Postop Assessment: no apparent nausea or vomiting Anesthetic complications: no   No notable events documented.  Last Vitals:  Vitals:   06/13/22 0605 06/13/22 1000  BP: (!) 92/50 100/60  Pulse: 66 68  Resp: 12 14  Temp: 36.9 C 36.6 C  SpO2: 96% 98%    Last Pain:  Vitals:   06/13/22 1000  TempSrc:   PainSc: 2                  Lynda Rainwater

## 2022-06-17 LAB — CYTOLOGY - NON PAP

## 2022-06-19 ENCOUNTER — Ambulatory Visit (INDEPENDENT_AMBULATORY_CARE_PROVIDER_SITE_OTHER): Payer: Managed Care, Other (non HMO) | Admitting: Obstetrics & Gynecology

## 2022-06-19 ENCOUNTER — Encounter (HOSPITAL_BASED_OUTPATIENT_CLINIC_OR_DEPARTMENT_OTHER): Payer: Self-pay | Admitting: Obstetrics & Gynecology

## 2022-06-19 VITALS — BP 138/95 | HR 72 | Ht 62.0 in | Wt 152.8 lb

## 2022-06-19 DIAGNOSIS — Z9889 Other specified postprocedural states: Secondary | ICD-10-CM

## 2022-06-19 MED ORDER — ESTRADIOL 0.05 MG/24HR TD PTWK: 0.0500 mg | MEDICATED_PATCH | TRANSDERMAL | 2 refills | Status: DC

## 2022-06-19 NOTE — Progress Notes (Signed)
GYNECOLOGY  VISIT  CC:   post op recheck  HPI: 46 y.o. G2P2 Married White or Caucasian female here for recheck after undergoing TLH/BSO/cystoscopy on 06/12/2022.  Denies bleeding.  Pain is under good control.  Bladder function is normal.  Having some fatigue.  Has not had a BM.  Has chronic constipation issues.  Is not uncomfortable.  She has started stool softeners.  Options discussed for treatment if needed.    Pathology reviewed:  Yes .  Questions answered.    MEDS:   Current Outpatient Medications on File Prior to Visit  Medication Sig Dispense Refill   ADVAIR DISKUS 250-50 MCG/ACT AEPB Inhale 1 puff into the lungs 2 (two) times daily.     ALPRAZolam (XANAX) 0.5 MG tablet Take 1 tablet (0.5 mg total) by mouth as needed for sleep. 30 tablet 1   ibuprofen (ADVIL) 800 MG tablet Take 1 tablet (800 mg total) by mouth every 8 (eight) hours as needed. 30 tablet 0   montelukast (SINGULAIR) 10 MG tablet Take 1 tablet by mouth at bedtime.     ondansetron (ZOFRAN ODT) 4 MG disintegrating tablet Take 1 tablet (4 mg total) by mouth every 8 (eight) hours as needed for nausea or vomiting. 20 tablet 0   promethazine (PHENERGAN) 25 MG tablet Take 1 tablet (25 mg total) by mouth every 6 (six) hours as needed for nausea or vomiting. 30 tablet 0   WEGOVY 2.4 MG/0.75ML SOAJ Inject into the skin. Takes on Monday.     albuterol (PROAIR HFA) 108 (90 Base) MCG/ACT inhaler every 4 (four) hours as needed.     No current facility-administered medications on file prior to visit.    SH:  Smoking No    PHYSICAL EXAMINATION:    BP (!) 138/95 (BP Location: Right Arm, Patient Position: Sitting, Cuff Size: Normal)   Pulse 72   Ht '5\' 2"'$  (1.575 m) Comment: reported  Wt 152 lb 12.8 oz (69.3 kg)   LMP 05/30/2022 (Exact Date)   BMI 27.95 kg/m     General appearance: alert, cooperative and appears stated age CV:  Regular rate and rhythm Lungs:  clear to auscultation, no wheezes, rales or rhonchi, symmetric air  entry Abdomen: soft, non-tender; bowel sounds normal; no masses,  no organomegaly Incisions:  C/D/I  Assessment/Plan: 1. Post-operative state - pt is staying out of work for now.  Has follow up scheduled in about a month.

## 2022-06-21 ENCOUNTER — Encounter (HOSPITAL_BASED_OUTPATIENT_CLINIC_OR_DEPARTMENT_OTHER): Payer: Self-pay | Admitting: Obstetrics & Gynecology

## 2022-06-21 ENCOUNTER — Other Ambulatory Visit (HOSPITAL_BASED_OUTPATIENT_CLINIC_OR_DEPARTMENT_OTHER): Payer: Self-pay | Admitting: Obstetrics & Gynecology

## 2022-07-02 ENCOUNTER — Encounter (HOSPITAL_BASED_OUTPATIENT_CLINIC_OR_DEPARTMENT_OTHER): Payer: Self-pay | Admitting: Obstetrics & Gynecology

## 2022-07-03 ENCOUNTER — Other Ambulatory Visit (HOSPITAL_BASED_OUTPATIENT_CLINIC_OR_DEPARTMENT_OTHER): Payer: Self-pay | Admitting: Obstetrics & Gynecology

## 2022-07-03 MED ORDER — NITROFURANTOIN MONOHYD MACRO 100 MG PO CAPS: 100.0000 mg | ORAL_CAPSULE | Freq: Two times a day (BID) | ORAL | 0 refills | Status: DC

## 2022-07-10 ENCOUNTER — Encounter (HOSPITAL_BASED_OUTPATIENT_CLINIC_OR_DEPARTMENT_OTHER): Payer: Self-pay | Admitting: Obstetrics & Gynecology

## 2022-07-12 ENCOUNTER — Encounter (HOSPITAL_BASED_OUTPATIENT_CLINIC_OR_DEPARTMENT_OTHER): Payer: Self-pay | Admitting: Obstetrics & Gynecology

## 2022-07-12 ENCOUNTER — Other Ambulatory Visit (HOSPITAL_BASED_OUTPATIENT_CLINIC_OR_DEPARTMENT_OTHER): Payer: Self-pay | Admitting: Obstetrics & Gynecology

## 2022-07-12 MED ORDER — ESTRADIOL 0.5 MG PO TABS: 0.5000 mg | ORAL_TABLET | Freq: Every day | ORAL | 1 refills | Status: AC

## 2022-07-17 ENCOUNTER — Encounter (HOSPITAL_BASED_OUTPATIENT_CLINIC_OR_DEPARTMENT_OTHER): Payer: Self-pay | Admitting: Obstetrics & Gynecology

## 2022-07-17 ENCOUNTER — Ambulatory Visit (INDEPENDENT_AMBULATORY_CARE_PROVIDER_SITE_OTHER): Payer: Managed Care, Other (non HMO) | Admitting: Obstetrics & Gynecology

## 2022-07-17 VITALS — BP 126/82 | HR 74 | Ht 62.0 in | Wt 149.8 lb

## 2022-07-17 DIAGNOSIS — Z9889 Other specified postprocedural states: Secondary | ICD-10-CM

## 2022-07-17 DIAGNOSIS — T7840XA Allergy, unspecified, initial encounter: Secondary | ICD-10-CM

## 2022-07-17 MED ORDER — TRIAMCINOLONE ACETONIDE 0.5 % EX OINT: 1.0000 | TOPICAL_OINTMENT | Freq: Two times a day (BID) | CUTANEOUS | 0 refills | Status: AC

## 2022-07-17 NOTE — Progress Notes (Signed)
GYNECOLOGY  VISIT  CC:   post op recheck  HPI: 46 y.o. G2P2 Married White or Caucasian female here for recheck after undergoing TLH/BSO/cystoscopy on 9/27.  She reports bleeding is none.  She has no pain.  Bowel function is Normal.  Bladder function is normal.    She started on HRT patch but had skin reaction to the adhesive.  She is now on oral estradiol 0.'5mg'$ .  She's not noticing any difference from a hot flash standpoint at this point but not ready to increase dosage yet.   MEDS:   Current Outpatient Medications on File Prior to Visit  Medication Sig Dispense Refill   ADVAIR DISKUS 250-50 MCG/ACT AEPB Inhale 1 puff into the lungs 2 (two) times daily.     ALPRAZolam (XANAX) 0.5 MG tablet Take 1 tablet (0.5 mg total) by mouth as needed for sleep. 30 tablet 1   estradiol (ESTRACE) 0.5 MG tablet Take 1 tablet (0.5 mg total) by mouth daily. 30 tablet 1   montelukast (SINGULAIR) 10 MG tablet Take 1 tablet by mouth at bedtime.     WEGOVY 2.4 MG/0.75ML SOAJ Inject into the skin. Takes on Monday.     albuterol (PROAIR HFA) 108 (90 Base) MCG/ACT inhaler every 4 (four) hours as needed.     No current facility-administered medications on file prior to visit.    SH:  Smoking No    PHYSICAL EXAMINATION:    BP 126/82 (BP Location: Left Arm, Patient Position: Sitting, Cuff Size: Normal)   Pulse 74   Ht '5\' 2"'$  (1.575 m) Comment: Reported  Wt 149 lb 12.8 oz (67.9 kg)   LMP 05/30/2022 (Exact Date)   BMI 27.40 kg/m     General appearance: alert, cooperative and appears stated age Abdomen: soft, non-tender Incisions:  C/D/I  Pelvic: External genitalia:  no lesions              Urethra:  normal appearing urethra with no masses, tenderness or lesions              Bartholins and Skenes: normal                 Vagina: normal appearing vagina with normal color and discharge, no lesions              Cervix: absent              Bimanual Exam:  Uterus:  uterus absent              Adnexa: no mass,  fullness, tenderness  Chaperone, Ezekiel Ina, RN, was present for exam.  Assessment/Plan: 1. Post-operative state - doing well.  May resume normal activity except pelvic rest for 12 full weeks post op.  2. Allergic reaction, initial encounter - triamcinolone ointment (KENALOG) 0.5 %; Apply 1 Application topically 2 (two) times daily. Use for up to 7 days.  Dispense: 30 g; Refill: 0

## 2022-07-26 ENCOUNTER — Encounter (HOSPITAL_BASED_OUTPATIENT_CLINIC_OR_DEPARTMENT_OTHER): Payer: Self-pay | Admitting: Obstetrics & Gynecology

## 2022-07-26 ENCOUNTER — Telehealth (HOSPITAL_BASED_OUTPATIENT_CLINIC_OR_DEPARTMENT_OTHER): Payer: Self-pay | Admitting: Obstetrics & Gynecology

## 2022-07-26 NOTE — Telephone Encounter (Signed)
Called pt in response to my chart message.  She was seen in PCP office this week.  Liver enzymes were really elevated.  CT scan was normal.  She had additional blood work drawn but she does not know results.  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  She did have normal liver enzymes done with PCP 3 weeks ago so this is definitively new.  Estrace was stopped just in case this is contributing.  She will keep me posted.

## 2023-05-05 ENCOUNTER — Ambulatory Visit (HOSPITAL_BASED_OUTPATIENT_CLINIC_OR_DEPARTMENT_OTHER): Payer: Managed Care, Other (non HMO) | Admitting: Obstetrics & Gynecology
